# Patient Record
Sex: Female | Born: 1980 | Race: White | Hispanic: No | Marital: Married | Smoking: Current every day smoker
Health system: Southern US, Community
[De-identification: ages and names within clinical notes are randomized; demographics above are authoritative.]

## PROBLEM LIST (undated history)

## (undated) DIAGNOSIS — M79673 Pain in unspecified foot: Secondary | ICD-10-CM

## (undated) DIAGNOSIS — T753XXA Motion sickness, initial encounter: Secondary | ICD-10-CM

## (undated) DIAGNOSIS — M1990 Unspecified osteoarthritis, unspecified site: Secondary | ICD-10-CM

## (undated) HISTORY — PX: TONSILLECTOMY: SUR1361

## (undated) HISTORY — DX: Unspecified osteoarthritis, unspecified site: M19.90

---

## 2005-02-14 ENCOUNTER — Emergency Department: Payer: Self-pay | Admitting: Unknown Physician Specialty

## 2006-12-17 ENCOUNTER — Ambulatory Visit: Payer: Self-pay

## 2007-03-08 ENCOUNTER — Observation Stay: Payer: Self-pay | Admitting: Obstetrics and Gynecology

## 2007-04-22 ENCOUNTER — Observation Stay: Payer: Self-pay | Admitting: Obstetrics and Gynecology

## 2007-04-23 ENCOUNTER — Inpatient Hospital Stay: Payer: Self-pay | Admitting: Obstetrics and Gynecology

## 2013-03-15 ENCOUNTER — Ambulatory Visit: Payer: Self-pay | Admitting: Physician Assistant

## 2014-10-08 ENCOUNTER — Other Ambulatory Visit: Payer: Self-pay

## 2014-10-09 ENCOUNTER — Telehealth: Payer: Self-pay

## 2014-10-09 NOTE — Telephone Encounter (Signed)
-----   Message from Nada LibmanVance W Brabham, MD sent at 10/08/2014  1:54 PM EST ----- Regarding: RE: pre-op antibiotic Yes, I think she got vanco last time ----- Message -----    From: Erenest Blankarol Sue Manju Kulkarni, RN    Sent: 10/08/2014   1:28 PM      To: Nada LibmanVance W Brabham, MD Subject: pre-op antibiotic                              Spoke with pt's significant other today.  Wilkie AyeKristy saw her ID doctor recently, Dr. Jerolyn Centerynthia Snyder.  Was advised to go ahead and get pre-op antibiotic, due to the risk of infection.  Do you want me to order the Pre-op antibiotic for tomorrow's procedure?

## 2015-01-29 HISTORY — PX: OTHER SURGICAL HISTORY: SHX169

## 2015-02-15 ENCOUNTER — Encounter: Admit: 2015-02-15 | Disposition: A | Discharge status: Home / Self Care | Payer: Self-pay

## 2015-02-22 ENCOUNTER — Encounter: Payer: Self-pay | Admitting: Physical Therapy

## 2015-02-22 ENCOUNTER — Ambulatory Visit: Payer: Managed Care, Other (non HMO) | Attending: Orthopedic Surgery | Admitting: Occupational Therapy

## 2015-02-22 ENCOUNTER — Encounter: Payer: Self-pay | Admitting: Occupational Therapy

## 2015-02-22 DIAGNOSIS — M25531 Pain in right wrist: Secondary | ICD-10-CM | POA: Diagnosis not present

## 2015-02-22 DIAGNOSIS — Z9889 Other specified postprocedural states: Secondary | ICD-10-CM | POA: Insufficient documentation

## 2015-02-22 DIAGNOSIS — R29898 Other symptoms and signs involving the musculoskeletal system: Secondary | ICD-10-CM

## 2015-02-22 DIAGNOSIS — R600 Localized edema: Secondary | ICD-10-CM

## 2015-02-22 DIAGNOSIS — M79641 Pain in right hand: Secondary | ICD-10-CM | POA: Insufficient documentation

## 2015-02-22 DIAGNOSIS — M25541 Pain in joints of right hand: Secondary | ICD-10-CM

## 2015-02-22 DIAGNOSIS — M25641 Stiffness of right hand, not elsewhere classified: Secondary | ICD-10-CM | POA: Diagnosis not present

## 2015-02-22 DIAGNOSIS — L905 Scar conditions and fibrosis of skin: Secondary | ICD-10-CM | POA: Diagnosis not present

## 2015-02-22 DIAGNOSIS — M6281 Muscle weakness (generalized): Secondary | ICD-10-CM | POA: Diagnosis not present

## 2015-02-22 NOTE — Patient Instructions (Addendum)
Pt was educated to cont HEP, scar management, edema control and there ex, and begin using right hand for light functional activity/ADL's (ie; combing hair, brushing teeth, eating, applying make-up, washing dishes etc), pt was educated to not lift >3# with ight hand and she verbalized understanding of this in the clinic today.

## 2015-02-22 NOTE — Therapy (Signed)
[facility] [address] [place], [address] Phone: [phone]   Fax:  [phone]  Occupational Therapy Treatment  Patient Details  Name: Kim Middleton MRN: [id number] Date of Birth: September 03, 1981 Referring Provider:  Kennedy Bucker, MD  Encounter Date: 02/22/2015      OT End of Session - 02/22/15 1332    Visit Number 3   Number of Visits 8   Date for OT Re-Evaluation 03/11/15  Pt has MD appt 03/11/15, anticipated back to work 03/03/15   Authorization Type [employer]   OT Start Time 0805   OT Stop Time 0845   OT Time Calculation (min) 40 min   Activity Tolerance Patient tolerated treatment well;No increased pain   Behavior During Therapy [facility] for tasks assessed/performed      Past Medical History  Diagnosis Date  . Arthritis     Psoriatic    Past Surgical History  Procedure Laterality Date  . Carpal tunnel Right     There were no vitals filed for this visit.  Visit Diagnosis:  Pain in right wrist  Pain in joint of right hand  Edema of hand  Decreased grip strength of right hand      Subjective Assessment - 02/22/15 0805    Subjective  IT is getting better - scar is healling - pain is 4/10    Patient Stated Goals 11th May, do you think I can go back to work?   Currently in Pain? Yes   Pain Score 4    Pain Location Hand   Pain Orientation Right                      OT Treatments/Exercises (OP) - 02/22/15 0001    Exercises   Exercises Hand   Hand Exercises   Other Hand Exercises Gentle AROM ex's right hand for tendon gliding, opposition, thumb PA, RA   Modalities   Modalities Contrast Bath  18 min right hand   Ultrasound   Ultrasound Location Right carpal tunnel/volar wrist   Ultrasound Parameters 3.76mHz continuous [place] at .08 x23min   Ultrasound Goals Edema;Pain;Other (Comment)  scar management   RUE Contrast Bath   Time 18 minutes   Comments Pt tolerated  treatment w/o incident   Manual Therapy   Manual Therapy Edema management;Joint mobilization;Massage;Other (comment)  Scar mob/massage and scar management 10 min       Pt was educated to cont HEP, scar management, edema control and ther ex, and begin using right hand for light functional activity/ADL's (ie; combing hair, brushing teeth, eating, applying make-up, washing dishes etc), pt was educated to not lift >3# with ight hand and she verbalized understanding of this in the clinic today.          OT Education - 02/22/15 1329    Education provided Yes   Education Details Home program, splint use, daily activity/light functional use/ADL's only   Person(s) Educated Patient   Methods Explanation;Demonstration   Comprehension Verbalized understanding          OT Short Term Goals - 02/22/15 1339    OT SHORT TERM GOAL #1   Title Decreased pain on PRWHE by at least 20 points in 2-4 weeks   Time 4   Period Weeks   Status On-going   OT SHORT TERM GOAL #2   Title Increased thumb AROM to WFL's to perform 3 point pinch by 2-3 #    Time 2  Period Weeks   Status On-going   OT SHORT TERM GOAL #3   Title Increased AROM wrist WFL's all planes to be able to perform work w/o increased symptoms   Time 3   Period Weeks   Status On-going   OT SHORT TERM GOAL #4   Title Increased healing of incision for pt yo do scar massage and tolerate pressure and different textures    Time 2   Period Weeks   Status On-going           OT Long Term Goals - 02/22/15 1343    OT LONG TERM GOAL #1   Title Increased grip strength right hand by 50% compared to left hand to improve PRWHE by 20 points   Time 4   Period Weeks   Status On-going               Plan - 02/22/15 1335    Clinical Impression Statement Pt was seen today s/p Right CTR for HEP, scar management, edema control and increased functional use. Pt's scar is fully closed at this time and she reports doing her HEP/scar  management 3x/day at home. She rates her pain today as 4/10 "It's getting better"   Pt will benefit from skilled therapeutic intervention in order to improve on the following deficits (Retired) Decreased activity tolerance;Increased edema;Decreased strength;Pain;Impaired UE functional use;Decreased range of motion;Decreased coordination;Impaired flexibility;Decreased scar mobility;Decreased skin integrity;Impaired perceived functional ability   Rehab Potential Good   OT Frequency 2x / week   OT Duration 4 weeks   OT Treatment/Interventions Self-care/ADL training;Scar mobilization;DME and/or AE instruction;Parrafin;Passive range of motion;Patient/family education;Fluidtherapy;Contrast Bath;Splinting;Therapeutic exercise;Manual Therapy;Therapeutic exercises;Moist Heat;Therapeutic activities   Plan Review home prgram and light functional use right hand for ADL's and light homemaking.   Consulted and Agree with Plan of Care Patient        Problem List There are no active problems to display for this patient.   Charletta CousinBarnhill, Amy Beth Dixon, OTR/L 02/22/2015, 1:48 PM  [place] [facility] [address] [place], [place], [address] Phone: [phone]   Fax:  [phone]

## 2015-02-24 ENCOUNTER — Ambulatory Visit: Payer: Managed Care, Other (non HMO) | Admitting: Occupational Therapy

## 2015-02-24 DIAGNOSIS — M25531 Pain in right wrist: Secondary | ICD-10-CM

## 2015-02-24 DIAGNOSIS — M25541 Pain in joints of right hand: Secondary | ICD-10-CM

## 2015-02-24 DIAGNOSIS — R29898 Other symptoms and signs involving the musculoskeletal system: Secondary | ICD-10-CM

## 2015-02-24 DIAGNOSIS — R600 Localized edema: Secondary | ICD-10-CM

## 2015-02-24 DIAGNOSIS — M79641 Pain in right hand: Secondary | ICD-10-CM | POA: Diagnosis not present

## 2015-02-24 NOTE — Therapy (Signed)
[facility] [address] [place], [place], [address] Phone: [phone]   Fax:  [phone]  Occupational Therapy Treatment  Patient Details  Name: Kim CowerKristie L Middleton MRN: [id number] Date of Birth: 08-10-1981 Referring Provider:  Kennedy BuckerMenz, Michael, MD  Encounter Date: 02/24/2015      OT End of Session - 02/24/15 0854    Visit Number 4   Number of Visits 8   Date for OT Re-Evaluation 03/11/15   Authorization Type [employer]   OT Start Time 0805   OT Stop Time 0856   OT Time Calculation (min) 51 min   Activity Tolerance Patient tolerated treatment well   Behavior During Therapy [facility] for tasks assessed/performed      Past Medical History  Diagnosis Date  . Arthritis     Psoriatic    Past Surgical History  Procedure Laterality Date  . Carpal tunnel Right     There were no vitals filed for this visit.  Visit Diagnosis:  Pain in right wrist  Pain in joint of right hand  Edema of hand  Decreased grip strength of right hand      Subjective Assessment - 02/24/15 0816    Subjective  Pain about 2/10 , more of soreness as I use it more - and more down from the wrist - use it also little more in light activities    Patient Stated Goals 11th May, do you think I can go back to work?   Currently in Pain? Yes   Pain Score 2    Pain Location Hand            [facility] OT Assessment - 02/24/15 0001    ROM / Strength   AROM / PROM / Strength AROM   AROM   Right/Left Wrist Right   Right Wrist Extension 65 Degrees   Right Wrist Flexion 62 Degrees   Right Wrist Radial Deviation 16 Degrees   Right Wrist Ulnar Deviation 30 Degrees                  OT Treatments/Exercises (OP) - 02/24/15 0001    Hand Exercises   Other Hand Exercises Gentle AROM ex's right hand for tendon gliding, opposition, thumb PA, RA   Other Hand Exercises Provided yellow foam to cue composite flexion   Ultrasound   Ultrasound Location R CT scar   Ultrasound Parameters 3.mHz   Ultrasound Goals Edema;Pain;Other (Comment)   RUE Contrast Bath   Time 18 minutes   Comments Doing contrast 2 x day - to decrease pain and edema    Splinting   Splinting CMC noeprene spoint during day but to start taking off 2hrs on and off , still wrist extention splint at night time    Manual Therapy   Manual Therapy Other (comment)   Other Manual Therapy Scar massage and edema control massage -done - dirfferent texture  but pressure  of knife still bothering - can  dry hand  light clapping                 OT Education - 02/24/15 0900    Education provided Yes   Education Details Upgraded HEP and begin removing day splint for an hour per day.   Person(s) Educated Patient   Methods Explanation;Demonstration   Comprehension Verbalized understanding          OT Short Term Goals - 02/24/15 0901    OT SHORT TERM GOAL #1  Title Decreased pain on PRWHE by at least 20 points in 2-4 weeks   Time 4   Period Weeks   Status On-going   OT SHORT TERM GOAL #2   Title Increased thumb AROM to WFL's to perform 3 point pinch by 2-3 #    Time 2   Period Weeks   Status On-going   OT SHORT TERM GOAL #3   Title Increased AROM wrist WFL's all planes to be able to perform work w/o increased symptoms   Time 3   Period Weeks   Status On-going   OT SHORT TERM GOAL #4   Title Increased healing of incision for pt yo do scar massage and tolerate pressure and different textures    Time 2   Period Weeks   Status Achieved           OT Long Term Goals - 02/24/15 [id number]    OT LONG TERM GOAL #1   Title Increased grip strength right hand by 50% compared to left hand to improve PRWHE by 20 points   Time 4   Period Weeks   Status On-going               Plan - 02/24/15 [id number]    Clinical Impression Statement Pts pain iis improving, second digit MCP, PIP is somewhat sore and stiff. She was given additional ex's for HEP  that should assist in increasing AROM, decreasing pain and edema R hand. She will begin to remove splint for an hour an hour a day, continuing to wear it during heavier activity.   Pt will benefit from skilled therapeutic intervention in order to improve on the following deficits (Retired) Decreased activity tolerance;Increased edema;Decreased strength;Pain;Impaired UE functional use;Decreased range of motion;Decreased coordination;Impaired flexibility;Decreased scar mobility;Decreased skin integrity;Impaired perceived functional ability   Rehab Potential Good   OT Frequency 2x / week   OT Duration 4 weeks   OT Treatment/Interventions Self-care/ADL training;Scar mobilization;DME and/or AE instruction;Parrafin;Passive range of motion;Patient/family education;Fluidtherapy;Contrast Bath;Splinting;Therapeutic exercise;Manual Therapy;Therapeutic exercises;Moist Heat;Therapeutic activities   Plan Check pain and begin weaning out of night splint, review upgraded HEP.   Consulted and Agree with Plan of Care Patient        Problem List There are no active problems to display for this patient.   Charletta CousinBarnhill, Amy Beth Dixon, OTR/L 02/24/2015, 9:06 AM  [place] [facility] [address] [place], [place], [address] Phone: [phone]   Fax:  [phone]

## 2015-02-24 NOTE — Patient Instructions (Addendum)
Pt ed on scar desensitization  - rougher texture , clapping   as well as prayer stretch  For wrist exention  AROM for UD and RD   Built up  Handles for ADL's tools recommmended AROM: Wrist Radial / Ulnar Deviation   Gently bend left wrist from side to side as far as possible. Repeat ____ times per set. Do ____ sets per session. Do ____ sessions per day.  Copyright  VHI. All rights reserved.  Composite Extension (Passive Flexor Stretch)   Sitting with elbows on table and palms together, slowly lower wrists toward table until stretch is felt. Be sure to keep palms together throughout stretch. Hold ____ seconds. Relax. Repeat ____ times. Do ____ sessions per day.  Copyright  VHI. All rights reserved.

## 2015-03-01 ENCOUNTER — Ambulatory Visit: Payer: Managed Care, Other (non HMO) | Admitting: Occupational Therapy

## 2015-03-01 DIAGNOSIS — M25541 Pain in joints of right hand: Secondary | ICD-10-CM

## 2015-03-01 DIAGNOSIS — R600 Localized edema: Secondary | ICD-10-CM

## 2015-03-01 DIAGNOSIS — M79641 Pain in right hand: Secondary | ICD-10-CM | POA: Diagnosis not present

## 2015-03-01 DIAGNOSIS — R29898 Other symptoms and signs involving the musculoskeletal system: Secondary | ICD-10-CM

## 2015-03-01 DIAGNOSIS — M25531 Pain in right wrist: Secondary | ICD-10-CM

## 2015-03-01 NOTE — Therapy (Signed)
[place] [facility] [address] [place], [place], [address] Phone: [phone]   Fax:  [phone]  Occupational Therapy Treatment  Patient Details  Name: Kim Middleton MRN: [id number] Date of Birth: [birth year] Referring Provider:  Evon SlackGaines, Thomas C, PA-C  Encounter Date: 03/01/2015      OT End of Session - 03/01/15 0845    Visit Number 5   Number of Visits 8   Date for OT Re-Evaluation 03/11/15   Authorization Type [employer]   OT Start Time 0803   OT Stop Time 0847   OT Time Calculation (min) 44 min   Activity Tolerance Patient tolerated treatment well   Behavior During Therapy [facility] for tasks assessed/performed      Past Medical History  Diagnosis Date  . Arthritis     Psoriatic    Past Surgical History  Procedure Laterality Date  . Carpal tunnel Right     There were no vitals filed for this visit.  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 No/denies            [facility] - 03/01/15 0001    Strength   Right/Left hand Right   Grip (lbs) 26# on right   VS 66# left    Right Hand Lateral Pinch 5 lbs  5# on Right vs L 14#   Right Hand 3 Point Pinch 5 lbs  5# right  vs 12# left   Right Hand AROM   R Index  MCP 0-90 80 Degrees   R Index PIP 0-100 85 Degrees   R Long  MCP 0-90 90 Degrees   R Long PIP 0-100 90 Degrees   R Ring  MCP 0-90 90 Degrees   R Ring PIP 0-100 90 Degrees   R Little  MCP 0-90 90 Degrees   R Little PIP 0-100 95 Degrees                  OT Treatments/Exercises (OP) - 03/01/15 0001    ADLs   ADL Comments  Use palms of hands avoid pinches secondary to h/o tendonitis and arthritis.  Reviewed ADL activities and use of palms, avoid lateral pinc   Exercises   Exercises Hand   Hand Exercises   Theraputty Grip  right hand (grip only)   Theraputty - Grip right hand x10 x1-2x/day   Other Hand Exercises Gentle AROM ex's right hand for tendon gliding, opposition, thumb PA, RA  Able to oppose to second fold of 5th   Other Hand Exercises Provided yellow foam to cue composite flexion   Modalities   Modalities Ultrasound;Paraffin   Ultrasound   Ultrasound Location Right carpal tunnel/volar wrist   Ultrasound Parameters 3.583mHz continuous @ .08 w/cm2 x4 min   Ultrasound Goals Other (Comment)  Edema, pain, scar management   RUE Paraffin   Number Minutes Paraffin 10 Minutes   RUE Paraffin Location Hand   Type --  Right hand in fist for increase flexion especially of IF   Comments --  Pt reports that heat assists in increaseing flexibility/ROM   Manual Therapy   Manual Therapy Edema management;Massage;Passive ROM;Joint mobilization;Other (comment)  Edmea management, carpal spread, scar mobilization/managemen   Other Manual Therapy Scar massage and edema control massage -done - dirfferent texture  but pressure can bother her at times..Vibration/massage especially to proximal ulnar side of scar.      ADL and functional use of hands: Pt educated to avoid lateral pinch for functional activity secondary to increased pain and h/o tendonitis & arthritis IF. Use palms of hands to carry, examples of daily activities provided.           OT Education - 03/01/15 0844    Education provided Yes   Education Details Added putty for grip; ADL's, use of palms for lifting, avoid lateral pinch.   Person(s) Educated Patient   Methods Explanation;Demonstration   Comprehension Verbalized understanding          OT Short Term Goals - 03/01/15 0858    OT SHORT TERM GOAL #1   Title Decreased pain on PRWHE by at  least 20 points in 2-4 weeks   Time 4   Period Weeks   Status On-going   OT SHORT TERM GOAL #2   Title Increased thumb AROM to WFL's to perform 3 point pinch by 2-3 #    Time 2   Period Weeks   Status On-going   OT SHORT TERM GOAL #3   Title Increased AROM wrist WFL's all planes to be able to perform work w/o increased symptoms   Time 3   Period Weeks   Status On-going   OT SHORT TERM GOAL #4   Title Increased healing of incision for pt yo do scar massage and tolerate pressure and different textures            OT Long Term Goals - 03/01/15 0859    OT LONG TERM GOAL #1   Title Increased grip strength right hand by 50% compared to left hand to improve PRWHE by 20 points   Time 4   Period Weeks   Status On-going               Plan - 03/01/15 0847    Clinical Impression Statement Pt making great progress in pain , healing of scar , arthritis still limiting her at 2nd digit - grip and prehension strenght taken this date decrease - iniitiated teal putty but 2nd digits had increase pain - pt to only do grip - but hold off on lat  and 3 point, pt to focus on scar massage     Pt will benefit from skilled therapeutic intervention in order to improve on the following deficits (Retired) Decreased activity tolerance;Increased edema;Decreased strength;Pain;Impaired UE functional use;Decreased range of motion;Decreased coordination;Impaired flexibility;Decreased scar mobility;Decreased skin integrity;Impaired perceived functional ability   Rehab Potential Good   OT Frequency 2x / week   OT Treatment/Interventions Self-care/ADL training;Scar mobilization;DME and/or AE instruction;Parrafin;Passive range of motion;Patient/family education;Fluidtherapy;Contrast Bath;Splinting;Therapeutic exercise;Manual Therapy;Therapeutic exercises;Moist Heat;Therapeutic activities   OT Home Exercise Plan PUtty gripping but pain less than 3/10 ; and scar massage   Consulted and Agree with Plan of Care  Patient        Problem List There are no active problems to display for this patient.   Oletta CohnuPreez, Valma Rotenberg 03/01/2015, 9:03 AM  [facility] [address] [place], [place], [address] Phone: [phone]   Fax:  [phone]

## 2015-03-01 NOTE — Patient Instructions (Signed)
ADL and functional use of hands: Pt educated to avoid lateral pinch for functional activity secondary to increased pain and h/o tendonitis & arthritis IF. Use palms of hands to carry, examples of daily activities provided.

## 2015-03-03 ENCOUNTER — Encounter: Payer: Self-pay | Admitting: Occupational Therapy

## 2015-03-04 ENCOUNTER — Encounter: Payer: Managed Care, Other (non HMO) | Admitting: Occupational Therapy

## 2015-03-08 ENCOUNTER — Encounter: Payer: Self-pay | Admitting: Occupational Therapy

## 2015-03-08 ENCOUNTER — Ambulatory Visit: Payer: Managed Care, Other (non HMO) | Admitting: Occupational Therapy

## 2015-03-08 DIAGNOSIS — R29898 Other symptoms and signs involving the musculoskeletal system: Secondary | ICD-10-CM

## 2015-03-08 DIAGNOSIS — M25531 Pain in right wrist: Secondary | ICD-10-CM

## 2015-03-08 DIAGNOSIS — R600 Localized edema: Secondary | ICD-10-CM

## 2015-03-08 DIAGNOSIS — M25541 Pain in joints of right hand: Secondary | ICD-10-CM

## 2015-03-08 DIAGNOSIS — M79641 Pain in right hand: Secondary | ICD-10-CM | POA: Diagnosis not present

## 2015-03-08 NOTE — Therapy (Signed)
[place] [facility] [address] [place], [place], [address] Phone: [phone]   Fax:  [phone]  Occupational Therapy Treatment  Patient Details  Name: Kim CowerKristie L Knee MRN: [id number] Date of Birth: 1981-02-19 Referring Provider:  Kennedy BuckerMenz, Michael, MD  Encounter Date: 03/08/2015      OT End of Session - 03/08/15 0930    OT Start Time 0801   OT Stop Time 0850   OT Time Calculation (min) 49 min      Past Medical History  Diagnosis Date  . Arthritis     Psoriatic    Past Surgical History  Procedure Laterality Date  . Carpal tunnel Right     There were no vitals filed for this visit.  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, puth broom, and then also unpacking some to the boxes -went back into my splints    Patient Stated Goals Get the pain better again and not using the splints    Pain Score 3    Pain Location Hand   Pain Orientation Right                      OT Treatments/Exercises (OP) - 03/08/15 0001    Exercises   Exercises Hand   Hand Exercises   Other Hand Exercises AROM of MC flexion with IP extention, blocked AROM intrinsic fist,, full fist to 2 cm foam block and then 1 cm foam block    Other Hand Exercises Unable to touch - add buddy strap to use one hour on and off during day to decrease stiffness and increase use of 2nd digit   Ultrasound   Ultrasound Location R CT scar    Ultrasound Parameters 3.3MHZ, CUS at 0.8 for 4 min    Ultrasound Goals Edema;Pain   RUE Contrast Bath   Time 18 minutes   Comments to decrease pain and edema    Splinting   Splinting Pt wearing wrist splint with metal  bar at work and sleeping    Manual Therapy   Manual Therapy Edema management;Other (comment)   Other Manual Therapy Scar masage and mobs done - provided pt iwith coban for traction to do scar mobs especially to proximal scar - tender and thick - and adhere - carpal spreads and husband can help -                 OT Education - 03/08/15 0927    Education provided Yes   Education Details see instructions   Methods Explanation;Demonstration;Verbal cues   Comprehension Verbalized understanding;Returned demonstration          OT Short Term Goals - 03/01/15 0858    OT SHORT TERM GOAL #1   Title Decreased pain on PRWHE by at least 20 points in 2-4 weeks   Time 4   Period Weeks   Status On-going   OT SHORT TERM GOAL #2   Title Increased thumb AROM to WFL's to perform 3 point pinch by 2-3 #    Time 2   Period Weeks   Status On-going  OT SHORT TERM GOAL #3   Title Increased AROM wrist WFL's all planes to be able to perform work w/o increased symptoms   Time 3   Period Weeks   Status On-going   OT SHORT TERM GOAL #4   Title Increased healing of incision for pt yo do scar massage and tolerate pressure and different textures            OT Long Term Goals - 03/01/15 0859    OT LONG TERM GOAL #1   Title Increased grip strength right hand by 50% compared to left hand to improve PRWHE by 20 points   Time 4   Period Weeks   Status On-going               Plan - 03/08/15 [id number]    Clinical Impression Statement Pt had this date increase edema and pain since started back to work alst WEd - pt wearing splints again on wrist - reviewed some joint protection principles and to do contrast - cont with scar massage and buddstrap to decrease stiffness at 2nd digit   Pt will benefit from skilled therapeutic intervention in order to improve on the following deficits (Retired) Decreased activity tolerance;Increased edema;Decreased strength;Pain;Impaired UE functional use;Decreased  range of motion;Decreased coordination;Impaired flexibility;Decreased scar mobility;Decreased skin integrity;Impaired perceived functional ability   Rehab Potential Good   OT Frequency 2x / week   OT Duration 4 weeks   OT Treatment/Interventions Self-care/ADL training;Scar mobilization;DME and/or AE instruction;Parrafin;Passive range of motion;Patient/family education;Fluidtherapy;Contrast Bath;Splinting;Therapeutic exercise;Manual Therapy;Therapeutic exercises;Moist Heat;Therapeutic activities   Plan Assess pt has appt with MD on Thursday , assess use of buddystrap and ROM at 2nd digit - pain level - able to use joint protection at work    OT Home Exercise Plan see instructions   Consulted and Agree with Plan of Care Patient        Problem List There are no active problems to display for this patient.   Oletta CohnuPreez, Marica Trentham OTR/L ,CLT 03/08/2015, 9:33 AM  [place] [facility] [address] , [place], [address] Phone: [phone]   Fax:  [phone]

## 2015-03-08 NOTE — Patient Instructions (Signed)
Scar massage can use coban for more traction - and then husband can do some carpal spreads  Prior to ROM    Buddy strap fitted with pt to use on 2nd and 3rd digit to decrease stiffness on 2nd

## 2015-03-11 ENCOUNTER — Ambulatory Visit: Payer: Managed Care, Other (non HMO) | Admitting: Occupational Therapy

## 2015-03-11 ENCOUNTER — Encounter: Payer: Self-pay | Admitting: Occupational Therapy

## 2015-03-11 DIAGNOSIS — M79641 Pain in right hand: Secondary | ICD-10-CM | POA: Diagnosis not present

## 2015-03-11 DIAGNOSIS — R29898 Other symptoms and signs involving the musculoskeletal system: Secondary | ICD-10-CM

## 2015-03-11 DIAGNOSIS — M25531 Pain in right wrist: Secondary | ICD-10-CM

## 2015-03-11 DIAGNOSIS — R600 Localized edema: Secondary | ICD-10-CM

## 2015-03-11 DIAGNOSIS — M25541 Pain in joints of right hand: Secondary | ICD-10-CM

## 2015-03-11 NOTE — Therapy (Signed)
[facility] [address] [place], [place], [address] Phone: [phone]   Fax:  [phone]  Occupational Therapy Treatment  Patient Details  Name: Kim Middleton MRN: [id number] Date of Birth: [birth year] Referring Provider:  Kennedy BuckerMenz, Michael, MD  Encounter Date: 03/11/2015      OT End of Session - 03/11/15 1221    Visit Number 7   Number of Visits 8   Date for OT Re-Evaluation 03/11/15   Authorization Type [employer]   OT Start Time [phone]   OT Stop Time 0844   OT Time Calculation (min) 40 min   Activity Tolerance Patient tolerated treatment well   Behavior During Therapy [facility] for tasks assessed/performed      Past Medical History  Diagnosis Date  . Arthritis     Psoriatic    Past Surgical History  Procedure Laterality Date  . Carpal tunnel Right     There were no vitals filed for this visit.  Visit Diagnosis:  Pain in right wrist - Plan: Ot plan of care cert/re-cert  Pain in joint of right hand - Plan: Ot plan of care cert/re-cert  Edema of hand - Plan: Ot plan of care cert/re-cert  Decreased grip strength of right hand - Plan: Ot plan of care cert/re-cert      Subjective Assessment - 03/11/15 0818    Subjective  I have more pain - about 7-8/10 coming in - mostly in 2nd digit and wrist - more swelling too - and trying modifying how I pickup do things - don't know if it is more my arthritis    Patient Stated Goals Get the pain better again and not using the splints    Currently in Pain? Yes   Pain Score 7    Pain Location Wrist   Pain Orientation Right   Pain Descriptors / Indicators Aching;Shooting   Pain Type Surgical pain   Pain Onset 1 to 4 weeks ago   Pain Relieving Factors Heat and ICe             [facility] OT Assessment - 03/11/15 0001    AROM   Right Wrist Extension 55 Degrees   Right Hand AROM   R Index  MCP 0-90 60 Degrees   R Index PIP 0-100 70 Degrees   R Long  MCP  0-90 75 Degrees   R Long PIP 0-100 90 Degrees   R Ring  MCP 0-90 82 Degrees   R Ring PIP 0-100 90 Degrees   R Little  MCP 0-90 82 Degrees   R Little PIP 0-100 95 Degrees                  OT Treatments/Exercises (OP) - 03/11/15 0001    Hand Exercises   Other Hand Exercises AROM of MC flexion with PIP extention , PIP flexion blocked and AROM to 3 fingers of OT out of palm in pain free range    Other Hand Exercises Unable to touch palm - to cont with buddy strap but hold off on any putty HEP and do not force fisting AROM past 2-3/10 pain    Ultrasound   Ultrasound Location R CTR scar    Ultrasound Parameters CUS at 3/.183mhz at 0.8 intensity 5 min at end of ession    Ultrasound Goals Pain   RUE Contrast Bath   Time 18 minutes   Comments decrease pain and edema    Splinting   Splinting Switch  to neoprene wrist splint to use at work for typing and lifting or gripping    Manual Therapy   Manual Therapy Edema management;Other (comment)   Other Manual Therapy Scar mobs done , carpal spreads ,  still adhere proximal at scar and tender                 OT Education - 03/11/15 1221    Education provided Yes   Education Details see pt instruction   Person(s) Educated Patient   Methods Explanation;Demonstration   Comprehension Verbalized understanding;Returned demonstration          OT Short Term Goals - 03/11/15 1226    OT SHORT TERM GOAL #1   Title Decreased pain on PRWHE by at least 20 points in 2-4 weeks   Time 4   Period Weeks   Status On-going   OT SHORT TERM GOAL #2   Title Increased thumb AROM to WFL's to perform 3 point pinch by 2-3 #    Time 2   Period Weeks   Status On-going   OT SHORT TERM GOAL #3   Title Increased AROM wrist WFL's all planes to be able to perform work w/o increased symptoms   Time 3   Period Weeks   Status On-going   OT SHORT TERM GOAL #4   Title Increased healing of incision for pt yo do scar massage and tolerate pressure and  different textures    Status Achieved           OT Long Term Goals - 03/11/15 1227    OT LONG TERM GOAL #1   Title Increased grip strength right hand by 50% compared to left hand to improve PRWHE by 20 points   Time 4   Period Weeks   Status On-going               Plan - 03/11/15 1223    Clinical Impression Statement Pt has this date increase pain and edema in R dominant hand - since she returned back to work - mostly in 2nd  digit MC and PIP - as well as over CTR ( volar wrist) - decrease AROM at wrist and digitis - unable to do any strenghtening - did educate pt on joint protection and modifications- pt has history of  Psoriatic arthritis - pain limitiing her progress - pt to see MD this date - will cont to increase ROM , decrease pain and if pain undercontral - will cont strengthenign    Pt will benefit from skilled therapeutic intervention in order to improve on the following deficits (Retired) Decreased activity tolerance;Increased edema;Decreased strength;Pain;Impaired UE functional use;Decreased range of motion;Decreased coordination;Impaired flexibility;Decreased scar mobility;Decreased skin integrity;Impaired perceived functional ability   Rehab Potential Good   Clinical Impairments Affecting Rehab Potential arthritis    OT Duration 4 weeks   Plan Check on what MD said - pain level and use of neoprene splint for wrist at work - assess grip and prehension - PRWHE   OT Home Exercise Plan see instructions   Consulted and Agree with Plan of Care Patient        Problem List There are no active problems to display for this patient.   Oletta CohnuPreez, Izaha Shughart OTR/L, CLT 03/11/2015, 12:38 PM  [facility] [address] [place], [place], [address] Phone: [phone]   Fax:  [phone]

## 2015-03-11 NOTE — Patient Instructions (Signed)
Pt to hold off on putty and AROM tendon gliding in pain free range - pain under 2-3/10   Instead of hard wrist splint - try neoprene wrist splint for cushioning and maybe no fighting splint as with hard one

## 2015-03-15 ENCOUNTER — Ambulatory Visit: Payer: Managed Care, Other (non HMO) | Admitting: Occupational Therapy

## 2015-03-15 ENCOUNTER — Encounter: Payer: Self-pay | Admitting: Occupational Therapy

## 2015-03-15 DIAGNOSIS — M25541 Pain in joints of right hand: Secondary | ICD-10-CM

## 2015-03-15 DIAGNOSIS — M79641 Pain in right hand: Secondary | ICD-10-CM | POA: Diagnosis not present

## 2015-03-15 DIAGNOSIS — R600 Localized edema: Secondary | ICD-10-CM

## 2015-03-15 DIAGNOSIS — M25531 Pain in right wrist: Secondary | ICD-10-CM

## 2015-03-15 DIAGNOSIS — R29898 Other symptoms and signs involving the musculoskeletal system: Secondary | ICD-10-CM

## 2015-03-15 NOTE — Patient Instructions (Addendum)
Cont HEP as previously outlined. Wear scar pad at noc. As pt states that she has not for past 2 nights. Issued elastic stockinette to hold scar pad in place at noc.

## 2015-03-15 NOTE — Therapy (Signed)
[facility] [address] [place], [address] Phone: [phone]   Fax:  [phone]  Occupational Therapy Treatment  Patient Details  Name: Kim Middleton MRN: [id number] Date of Birth: 1980-12-07 Referring Provider:  Kennedy Bucker, MD  Encounter Date: 03/15/2015      OT End of Session - 03/15/15 0849    Visit Number 8   Number of Visits 8   Date for OT Re-Evaluation 03/11/15   Authorization Type [employer]   OT Start Time 0800   OT Stop Time [phone]   OT Time Calculation (min) 42 min   Activity Tolerance Patient tolerated treatment well   Behavior During Therapy [employer] for tasks assessed/performed      Past Medical History  Diagnosis Date  . Arthritis     Psoriatic    Past Surgical History  Procedure Laterality Date  . Carpal tunnel Right     There were no vitals filed for this visit.  Visit Diagnosis:  Pain in right wrist  Pain in joint of right hand  Edema of hand  Decreased grip strength of right hand      Subjective Assessment - 03/15/15 0809    Subjective  Pain continuing in right IF,3-4/10 pain. Pt reports that she is performing HEP and ther ex, and is wondering if symptoms are related to arthritis than along carpal tunnel. Pt is taking a new medication that she began on Sat. (see medication list).   Patient Stated Goals Get the pain better again and not using the splints    Currently in Pain? Yes   Pain Score 3    Pain Location Other (Comment)  Hand/wrist   Pain Orientation Right   Pain Descriptors / Indicators Aching;Sore;Other (Comment)  Stiff   Pain Type Surgical pain   Pain Onset 1 to 4 weeks ago   Pain Relieving Factors Heat and ice                      OT Treatments/Exercises (OP) - 03/15/15 0001    Exercises   Exercises Hand   Hand Exercises   Other Hand Exercises AROM of MC flexion with PIP extention , PIP flexion blocked and AROM to 3  fingers of OT out of palm in pain free range    Other Hand Exercises Unable to touch palm - to cont with buddy strap but hold off on any putty HEP and do not force fisting AROM past 2-3/10 pain    Modalities   Modalities Ultrasound   Ultrasound   Ultrasound Location R CTR scar   Ultrasound Parameters CUS @ 3.3 0.8w/cm2 x5 min   Ultrasound Goals Edema;Pain   RUE Contrast Bath   Time 18 minutes   Comments Decrease pai nand edema   Splinting   Splinting Pt reports  neoprene wrist splint use at work for typing and lifting or gripping    Manual Therapy   Manual Therapy Edema management;Other (comment)   Other Manual Therapy Scar mobs done , carpal spreads ,  still adhere proximal at scar and tender       Cont HEP as previously outlined. Wear scar pad at noc. As pt states that she has not for past 2 nights. Issued elastic stockinette to hold scar pad in place at noc.            OT Education - 03/15/15 0848    Education provided Yes   Education Details Cont  HEP as previously outlined. Wear scar pad at noc. As pt states that she has not for past 2 nights.   Person(s) Educated Patient   Methods Explanation;Demonstration   Comprehension Verbalized understanding;Returned demonstration          OT Short Term Goals - 03/11/15 1226    OT SHORT TERM GOAL #1   Title Decreased pain on PRWHE by at least 20 points in 2-4 weeks   Time 4   Period Weeks   Status On-going   OT SHORT TERM GOAL #2   Title Increased thumb AROM to WFL's to perform 3 point pinch by 2-3 #    Time 2   Period Weeks   Status On-going   OT SHORT TERM GOAL #3   Title Increased AROM wrist WFL's all planes to be able to perform work w/o increased symptoms   Time 3   Period Weeks   Status On-going   OT SHORT TERM GOAL #4   Title Increased healing of incision for pt yo do scar massage and tolerate pressure and different textures    Status Achieved           OT Long Term Goals - 03/11/15 1227    OT LONG TERM  GOAL #1   Title Increased grip strength right hand by 50% compared to left hand to improve PRWHE by 20 points   Time 4   Period Weeks   Status On-going               Plan - 03/15/15 0851    Clinical Impression Statement Pt has noted increased edema and pain right dominant hand/wrist w/ return to work > in IF at MCP/PIP & volar wrist over carpal tunnel. Pt is currently unable to perform strengthening, and has begun a new medication for pain/inflammation. Pt states that buddy strap use on IF/MF have asisted in increasing AROM and she should benefit from cont hand therapy to assress symptoms.   Pt will benefit from skilled therapeutic intervention in order to improve on the following deficits (Retired) Decreased activity tolerance;Increased edema;Decreased strength;Pain;Impaired UE functional use;Decreased range of motion;Decreased coordination;Impaired flexibility;Decreased scar mobility;Decreased skin integrity;Impaired perceived functional ability   Rehab Potential Good   Clinical Impairments Affecting Rehab Potential arthritis    OT Frequency 2x / week   OT Duration 4 weeks   OT Treatment/Interventions Self-care/ADL training;Scar mobilization;DME and/or AE instruction;Parrafin;Passive range of motion;Patient/family education;Fluidtherapy;Contrast Bath;Splinting;Therapeutic exercise;Manual Therapy;Therapeutic exercises;Moist Heat;Therapeutic activities   Plan Assess grip/prehension and PRWHE next visit   Consulted and Agree with Plan of Care Patient        Problem List There are no active problems to display for this patient.   Charletta CousinBarnhill, Collie Kittel Beth Dixon, OTR/L 03/15/2015, 9:46 AM  [facility] [address] [place], [place], [address] Phone: [phone]   Fax:  [phone]

## 2015-03-18 ENCOUNTER — Ambulatory Visit: Payer: Managed Care, Other (non HMO) | Admitting: Occupational Therapy

## 2015-03-18 DIAGNOSIS — M79641 Pain in right hand: Secondary | ICD-10-CM | POA: Diagnosis not present

## 2015-03-18 DIAGNOSIS — M25541 Pain in joints of right hand: Secondary | ICD-10-CM

## 2015-03-18 DIAGNOSIS — R29898 Other symptoms and signs involving the musculoskeletal system: Secondary | ICD-10-CM

## 2015-03-18 DIAGNOSIS — M25531 Pain in right wrist: Secondary | ICD-10-CM

## 2015-03-18 DIAGNOSIS — R600 Localized edema: Secondary | ICD-10-CM

## 2015-03-18 NOTE — Therapy (Signed)
[facility] [address] [place], [place], [address] Phone: [phone]   Fax:  [phone]  Occupational Therapy Treatment  Patient Details  Name: Kim Middleton MRN: [id number] Date of Birth: February 02, 1981 Referring Provider:  Evon SlackGaines, Thomas C, PA-C  Encounter Date: 03/18/2015      OT End of Session - 03/18/15 0915    Visit Number 9   Number of Visits 14   Date for OT Re-Evaluation 04/08/15   Authorization Type [employer]   OT Start Time 0802   OT Stop Time 0846   OT Time Calculation (min) 44 min   Activity Tolerance Patient tolerated treatment well   Behavior During Therapy [facility] for tasks assessed/performed      Past Medical History  Diagnosis Date  . Arthritis     Psoriatic    Past Surgical History  Procedure Laterality Date  . Carpal tunnel Right     There were no vitals filed for this visit.  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 Yes   Pain Score 4    Pain Location Hand   Pain Orientation Right   Pain Descriptors / Indicators Aching   Pain Type Surgical pain   Pain Onset 1 to 4 weeks ago            [facility] Assessment - 03/18/15 0001    AROM   Right Wrist Extension 62 Degrees   Right Wrist Flexion 58 Degrees   Right Wrist Radial Deviation 17 Degrees   Right Wrist Ulnar Deviation 38 Degrees   Strength   Right Hand Grip (lbs) 10   Right Hand Lateral Pinch 3 lbs   Right Hand 3 Point Pinch 4 lbs   Left Hand AROM   L Thumb MCP 0-60 55 Degrees   L Thumb IP 0-80 10 Degrees   L Index  MCP 0-90 65 Degrees   L Index PIP 0-100 80 Degrees   L Long  MCP 0-90 75 Degrees   L Long PIP 0-100 100 Degrees                  OT Treatments/Exercises (OP) - 03/18/15 0001    Hand Exercises   Other Hand Exercises AROM   Other Hand Exercises Unable to touch palm - to cont with buddy strap but hold off on any putty HEP and do not force fisting AROM past 2-3/10 pain    Ultrasound   Ultrasound Location R carpal tunnel and volar wrist    Ultrasound Parameters CUS at 3.3MHZ  at 0.8 over CTR for 4 min prior to manual - - AND then this date over posterior 2nd MC and thumb MP -  [place] at 20% at 1.0 intensity for 4 min at end of session    Ultrasound Goals Edema;Pain;Other (Comment)   Splinting   Splinting Pt to switch to [facility] neoprene splint for thumb and some compression to  poster   Manual Therapy   Manual Therapy Edema management;Other (comment)   Other Manual Therapy Scar mobs done , carpal spreads ,  still adhere proximal at scar and tender                 OT Education - 03/18/15 0914    Education provided Yes   Education Details HEP and splint wearing    Person(s) Educated Patient   Methods Explanation;Demonstration;Verbal cues   Comprehension Verbalized understanding;Returned demonstration;Verbal cues required          OT Short Term Goals - 03/18/15 0919    OT SHORT TERM GOAL #1   Title Decreased pain on PRWHE by at least 20 points in 2-4 weeks   Baseline PRWHE now pain 41/50 WAS 42/15; Function now 35.5/50  WAS 46/50   Time 3   Period Weeks   Status On-going   OT SHORT TERM GOAL #2   Title Increased thumb AROM to WFL's to perform 3 point pinch by 2-3 #    Baseline Prehension 4-5 lbs on L   Time 3   Period Weeks   Status  On-going   OT SHORT TERM GOAL #3   Title Increased AROM wrist WFL's all planes to be able to perform work w/o increased symptoms   Time 3   Period Weeks   Status On-going   OT SHORT TERM GOAL #4   Title Increased healing of incision for pt yo do scar massage and tolerate pressure and different textures    Baseline Set back with arthritis when she went back to work - still deep pressure is discomfort   Time 2   Period Weeks   Status On-going           OT Long Term Goals - 03/18/15 1610    OT LONG TERM GOAL #1   Title Increased grip strength right hand by 50% compared to left hand to improve PRWHE by 20 points   Time 3   Period Weeks   Status On-going               Plan - 03/18/15 0916    Clinical Impression Statement Pt cont to have increase pain over thumb and 2nd MC - mostly arthriits - CTR appear improving well but still scar adhere proximal  but grip and prehension decrease as well as edema /pain increased - AROM of 2nd diigit decrease - phone DR Gavin Potters rheumatholgy and nsg will phone her to get her in and assess  - pt can benefit from  cont services to increase ROM and strength if pain decrease    Pt will benefit from skilled therapeutic intervention in order to improve on the following deficits (Retired) Decreased activity tolerance;Increased edema;Decreased strength;Pain;Impaired UE functional use;Decreased range of motion;Decreased coordination;Impaired flexibility;Decreased scar mobility;Decreased skin integrity;Impaired perceived functional ability   Rehab Potential Good   Clinical Impairments Affecting Rehab Potential arthritis    OT Frequency 2x / week   OT Duration Other (comment)   OT Treatment/Interventions Self-care/ADL training;Scar mobilization;DME and/or AE instruction;Parrafin;Passive range of motion;Patient/family education;Fluidtherapy;Contrast Bath;Splinting;Therapeutic exercise;Manual Therapy;Therapeutic exercises;Moist Heat;Therapeutic activities    OT Home Exercise Plan see instructions   Consulted and Agree with Plan of Care Patient  Problem List There are no active problems to display for this patient.   Elida Harbin OTR/L ,CLT 03/18/2015, 9:28 AM  [facility] [address] [place], [address] Phone: [phone]   Fax:  [phone]

## 2015-03-18 NOTE — Patient Instructions (Signed)
Pt to only do AROM without increasing pain -  Switch back to thumb neoprene splint  For compression over thumb and support as well as over posterior hand

## 2015-03-24 ENCOUNTER — Ambulatory Visit: Payer: Managed Care, Other (non HMO) | Attending: Orthopedic Surgery | Admitting: Occupational Therapy

## 2015-03-29 ENCOUNTER — Ambulatory Visit: Payer: Managed Care, Other (non HMO) | Admitting: Occupational Therapy

## 2015-03-31 ENCOUNTER — Ambulatory Visit: Payer: Managed Care, Other (non HMO) | Admitting: Occupational Therapy

## 2015-04-05 ENCOUNTER — Ambulatory Visit: Payer: Managed Care, Other (non HMO) | Admitting: Occupational Therapy

## 2015-04-07 ENCOUNTER — Ambulatory Visit: Payer: Managed Care, Other (non HMO) | Admitting: Occupational Therapy

## 2015-04-12 ENCOUNTER — Encounter: Payer: Managed Care, Other (non HMO) | Admitting: Occupational Therapy

## 2015-04-15 ENCOUNTER — Encounter: Payer: Managed Care, Other (non HMO) | Admitting: Occupational Therapy

## 2015-04-19 ENCOUNTER — Encounter: Payer: Managed Care, Other (non HMO) | Admitting: Occupational Therapy

## 2015-04-21 ENCOUNTER — Encounter: Payer: Managed Care, Other (non HMO) | Admitting: Occupational Therapy

## 2016-03-04 ENCOUNTER — Encounter: Payer: Self-pay | Admitting: Emergency Medicine

## 2016-03-04 ENCOUNTER — Emergency Department
Admission: EM | Admit: 2016-03-04 | Discharge: 2016-03-04 | Disposition: A | Discharge status: Home / Self Care | Payer: Managed Care, Other (non HMO) | Attending: Emergency Medicine | Admitting: Emergency Medicine

## 2016-03-04 ENCOUNTER — Emergency Department: Payer: Managed Care, Other (non HMO)

## 2016-03-04 DIAGNOSIS — S20211A Contusion of right front wall of thorax, initial encounter: Secondary | ICD-10-CM | POA: Diagnosis not present

## 2016-03-04 DIAGNOSIS — S299XXA Unspecified injury of thorax, initial encounter: Secondary | ICD-10-CM | POA: Diagnosis present

## 2016-03-04 DIAGNOSIS — Y929 Unspecified place or not applicable: Secondary | ICD-10-CM | POA: Diagnosis not present

## 2016-03-04 DIAGNOSIS — Y9384 Activity, sleeping: Secondary | ICD-10-CM | POA: Insufficient documentation

## 2016-03-04 DIAGNOSIS — W1789XA Other fall from one level to another, initial encounter: Secondary | ICD-10-CM | POA: Diagnosis not present

## 2016-03-04 DIAGNOSIS — Y999 Unspecified external cause status: Secondary | ICD-10-CM | POA: Diagnosis not present

## 2016-03-04 DIAGNOSIS — F17210 Nicotine dependence, cigarettes, uncomplicated: Secondary | ICD-10-CM | POA: Insufficient documentation

## 2016-03-04 MED ORDER — HYDROCODONE-ACETAMINOPHEN 5-325 MG PO TABS: 1.0000 | ORAL_TABLET | ORAL | Status: DC | PRN

## 2016-03-04 MED ORDER — CYCLOBENZAPRINE HCL 10 MG PO TABS: 10.0000 mg | ORAL_TABLET | Freq: Three times a day (TID) | ORAL | Status: DC | PRN

## 2016-03-04 MED ORDER — HYDROCODONE-ACETAMINOPHEN 5-325 MG PO TABS
1.0000 | ORAL_TABLET | Freq: Once | ORAL | Status: AC
  Administered 2016-03-04: 1 via ORAL
  Filled 2016-03-04: qty 1

## 2016-03-04 NOTE — ED Notes (Signed)
Larey SeatFell out of hammock one week ago, pain R chest pain since.

## 2016-03-04 NOTE — ED Notes (Signed)
Discussed discharge instructions, prescriptions, and follow-up care with patient. No questions or concerns at this time. Pt stable at discharge.  

## 2016-03-04 NOTE — ED Provider Notes (Signed)
[facility] Provider Note ____________________________________________  Time seen: Approximately 4:39 PM  I have reviewed the triage vital signs and the nursing notes.   HISTORY  Chief Complaint Chest Injury    HPI Kim Middleton is a 35 y.o. female who presents to the emergency department for evaluation of right rib pain. She was asleep in the hammock about a week ago when it broke and she fell to the ground. Pain has not improved over the week with OTC medications. She denies dyspnea.  Past Medical History  Diagnosis Date  . Arthritis     Psoriatic    There are no active problems to display for this patient.   Past Surgical History  Procedure Laterality Date  . Carpal tunnel Right   . Tonsillectomy      Current Outpatient Rx  Name  Route  Sig  Dispense  Refill  . Apremilast 30 MG TABS   Oral   Take by mouth.         . cyclobenzaprine (FLEXERIL) 10 MG tablet   Oral   Take 1 tablet (10 mg total) by mouth 3 (three) times daily as needed for muscle spasms.   30 tablet   0   . etodolac (LODINE) 500 MG tablet   Oral   Take 500 mg by mouth 2 (two) times daily.         Marland Kitchen. HYDROcodone-acetaminophen (NORCO/VICODIN) 5-325 MG tablet   Oral   Take 1 tablet by mouth every 4 (four) hours as needed.   12 tablet   0     Allergies Latex and Nickel  No family history on file.  Social History Social History  Substance Use Topics  . Smoking status: Current Every Day Smoker -- 1.00 packs/day    Types: Cigarettes  . Smokeless tobacco: None  . Alcohol Use: No    Review of Systems Constitutional: No recent illness. Cardiovascular: Denies chest pain or palpitations. Respiratory: Denies shortness of breath. Musculoskeletal: Pain in right rib area Skin: Negative for rash, wound, lesion. Neurological: Negative for focal weakness or numbness.  ____________________________________________   PHYSICAL EXAM:  VITAL  SIGNS: ED Triage Vitals  Enc Vitals Group     BP 03/04/16 1624 137/78 mmHg     Pulse Rate 03/04/16 1624 77     Resp 03/04/16 1624 18     Temp 03/04/16 1624 98.4 F (36.9 C)     Temp Source 03/04/16 1624 Oral     SpO2 03/04/16 1624 100 %     Weight 03/04/16 1624 190 lb (86.183 kg)     Height 03/04/16 1624 5\' 7"  (1.702 m)     Head Cir --      Peak Flow --      Pain Score 03/04/16 1626 3     Pain Loc --      Pain Edu? --      Excl. in GC? --     Constitutional: Alert and oriented. Well appearing and in no acute distress. Eyes: Conjunctivae are normal. EOMI. Head: Atraumatic. Neck: No stridor.  Respiratory: Normal respiratory effort.  Breath sounds clear. Musculoskeletal: Right lateral thorax tender to palpation without flail segment.  Neurologic:  Normal speech and language. No gross focal neurologic deficits are appreciated. Speech is normal. No gait instability. Skin:  Skin is warm, dry and intact. Atraumatic. Psychiatric: Mood and affect are normal. Speech and behavior are normal.  ____________________________________________   LABS (all labs ordered are listed, but only abnormal  results are displayed)  Labs Reviewed - No data to display ____________________________________________  RADIOLOGY  Negative for acute abnormality per radiology. ____________________________________________   PROCEDURES  Procedure(s) performed: None   ____________________________________________   INITIAL IMPRESSION / ASSESSMENT AND PLAN / ED COURSE  Pertinent labs & imaging results that were available during my care of the patient were reviewed by me and considered in my medical decision making (see chart for details).  Patient to take norco as prescribed for severe pain. She is to follow up with the PCP for symptoms that are not improving over the week or return to the ER for symptoms that change or worsen. ____________________________________________   FINAL CLINICAL  IMPRESSION(S) / ED DIAGNOSES  Final diagnoses:  Rib contusion, right, initial encounter       Chinita Pester, FNP 03/04/16 2310  Jene Every, MD 03/04/16 2325

## 2018-05-12 IMAGING — CR DG RIBS W/ CHEST 3+V*R*
4 series · 5 of 5 positions shown · non-contrast
Comparison: None.

CLINICAL DATA: Fell [DATE] week ago

EXAM:
RIGHT RIBS AND CHEST - 3+ VIEW

[chest pa]
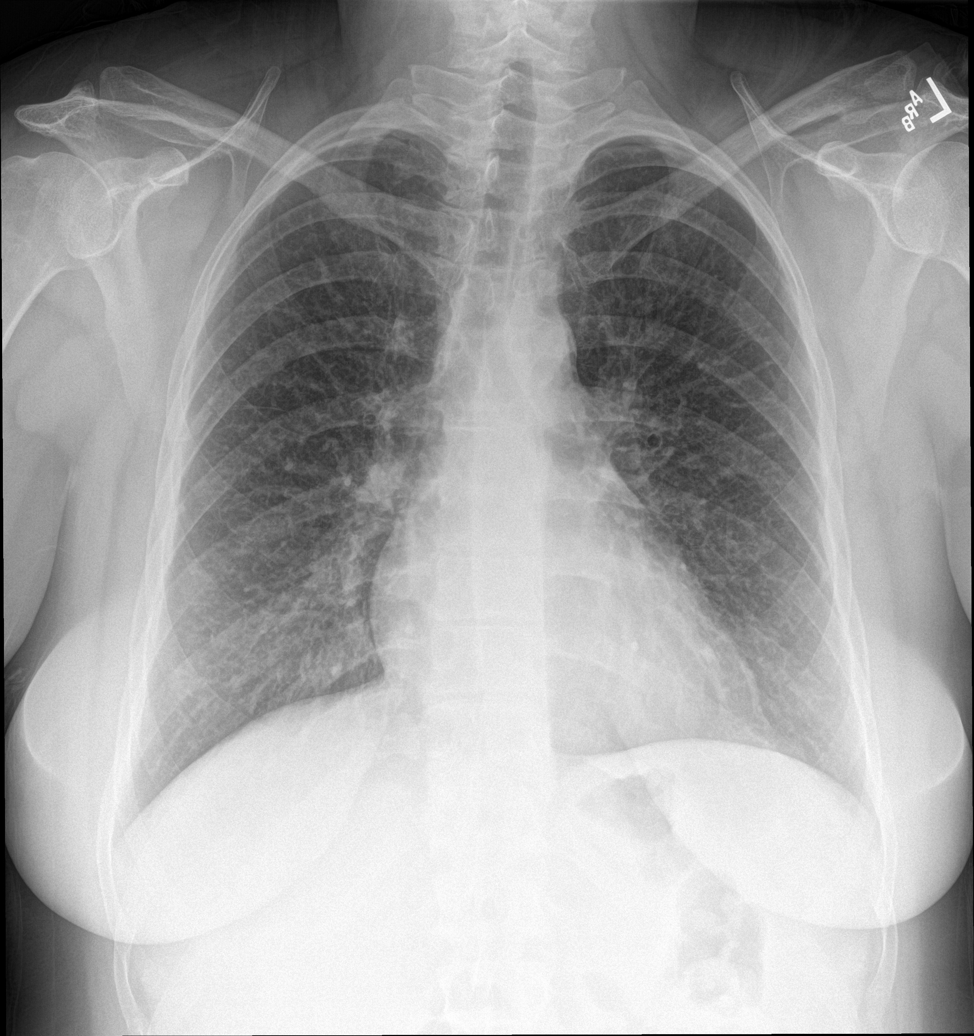

[rib pa]
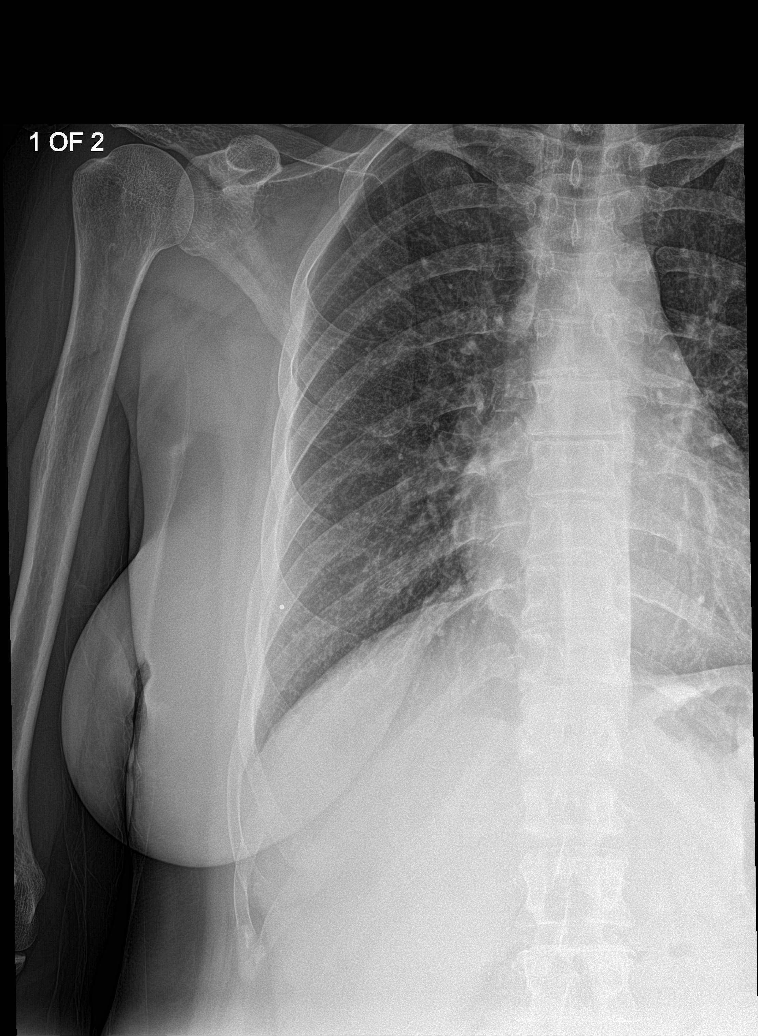

[rib ap]
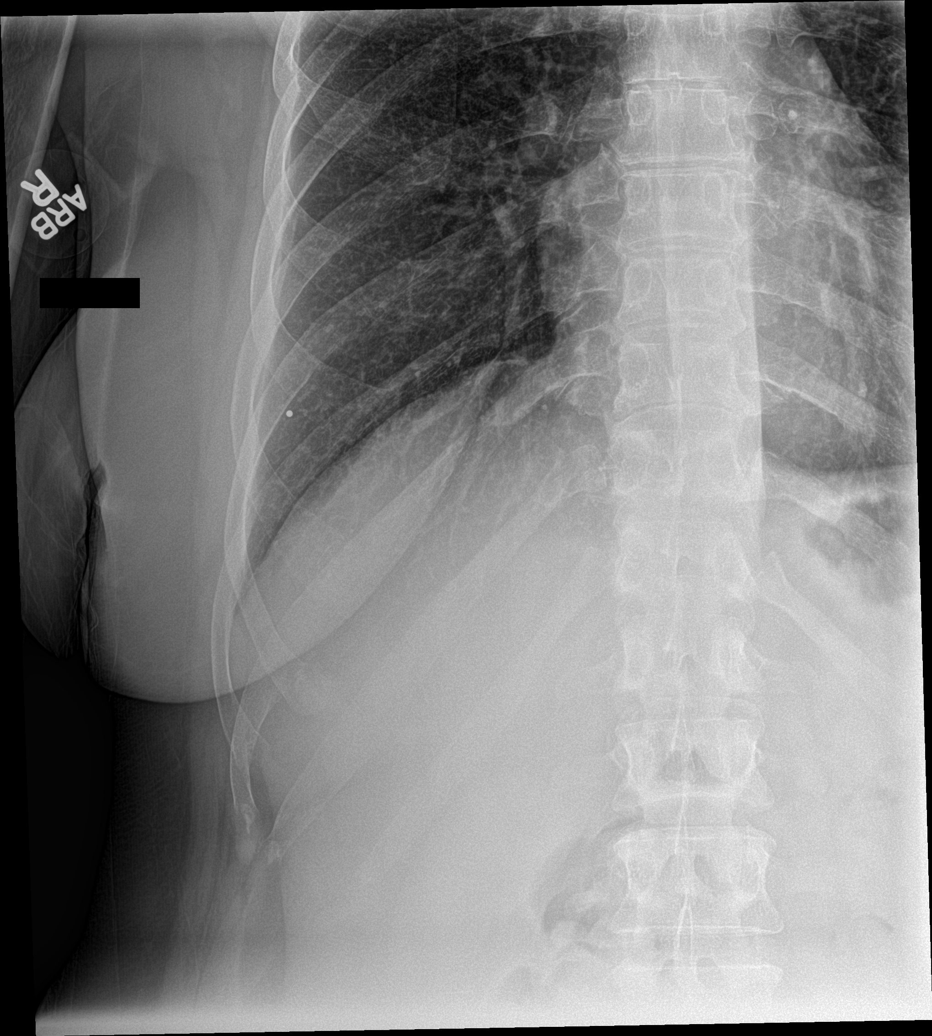

[Series 5: rib ap obl · 0.14mm/px · 2 of 2 slices shown]
[im 1/2]
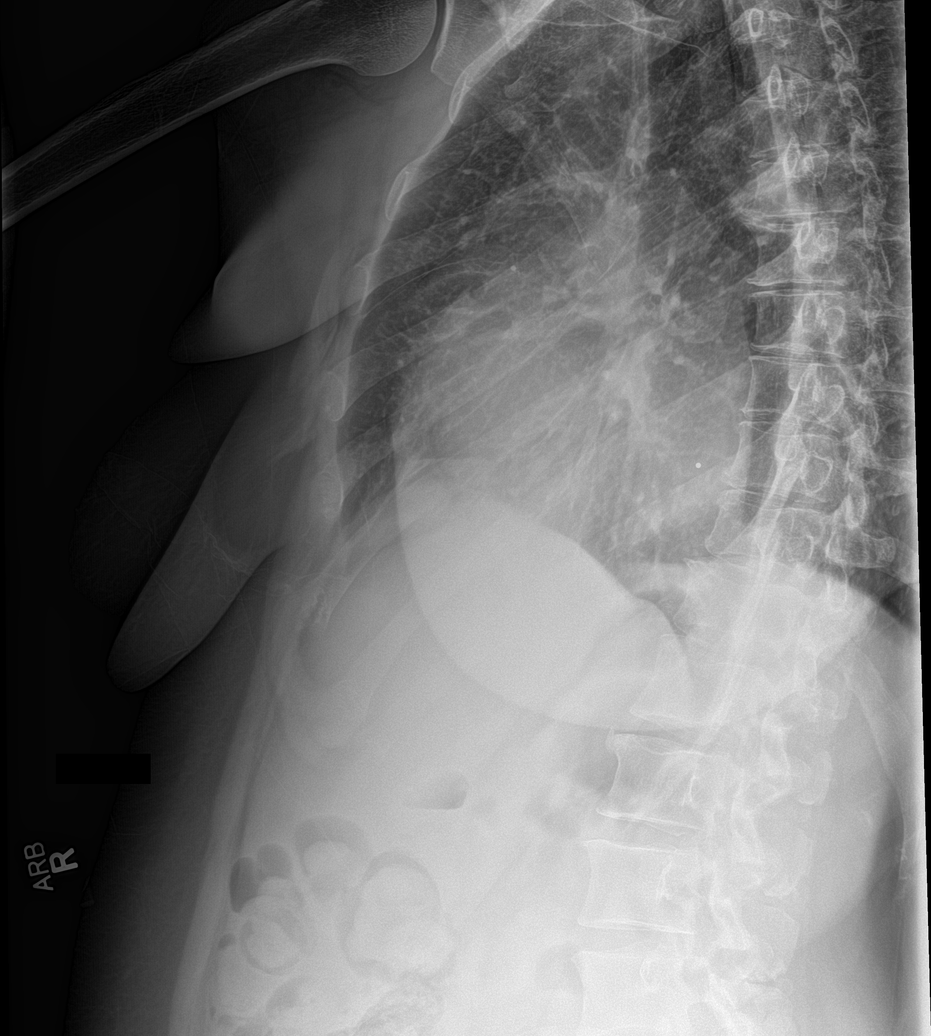
[im 2/2]
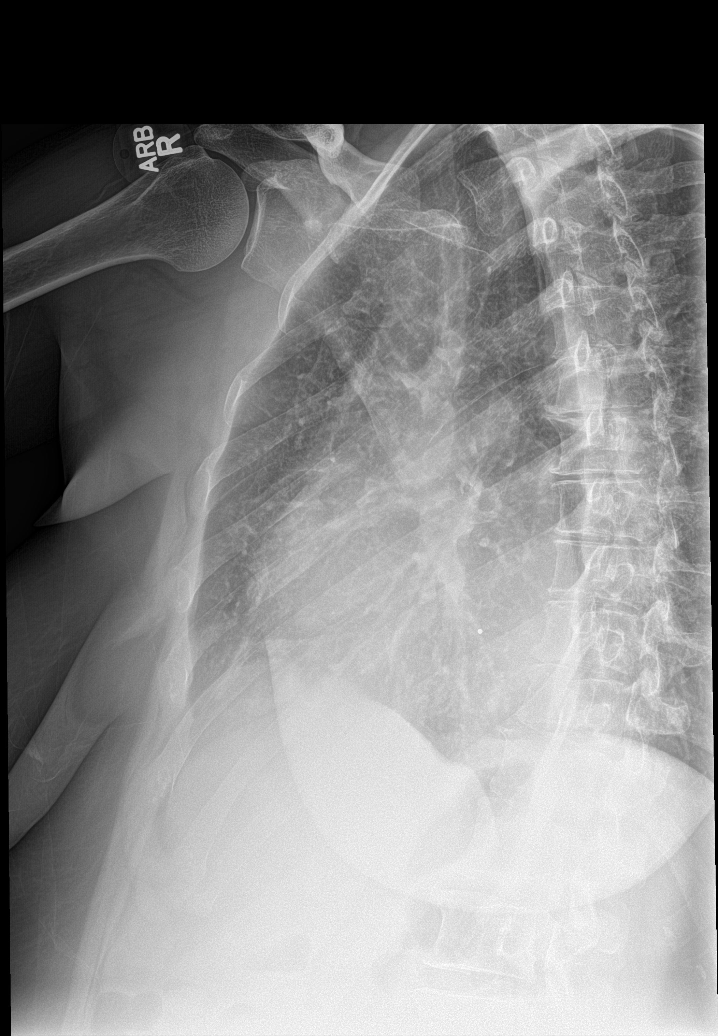

[5 of 5 positions shown; findings below may reference images not displayed]

FINDINGS: Five views right ribs submitted. No acute infiltrate or pulmonary
edema. No right rib fracture is identified. There is no
pneumothorax.
IMPRESSION: Negative.

## 2019-10-29 ENCOUNTER — Other Ambulatory Visit: Payer: Managed Care, Other (non HMO)

## 2019-12-01 ENCOUNTER — Other Ambulatory Visit: Payer: Managed Care, Other (non HMO)

## 2019-12-02 ENCOUNTER — Ambulatory Visit: Payer: Managed Care, Other (non HMO) | Attending: Internal Medicine

## 2019-12-02 DIAGNOSIS — Z20822 Contact with and (suspected) exposure to covid-19: Secondary | ICD-10-CM | POA: Insufficient documentation

## 2019-12-03 LAB — NOVEL CORONAVIRUS, NAA: SARS-CoV-2, NAA: NOT DETECTED

## 2019-12-15 ENCOUNTER — Other Ambulatory Visit: Payer: Self-pay

## 2019-12-15 ENCOUNTER — Ambulatory Visit: Payer: Managed Care, Other (non HMO) | Attending: Internal Medicine

## 2019-12-15 DIAGNOSIS — Z20822 Contact with and (suspected) exposure to covid-19: Secondary | ICD-10-CM

## 2019-12-16 LAB — NOVEL CORONAVIRUS, NAA: SARS-CoV-2, NAA: NOT DETECTED

## 2020-06-03 DIAGNOSIS — J329 Chronic sinusitis, unspecified: Secondary | ICD-10-CM

## 2020-06-03 HISTORY — DX: Chronic sinusitis, unspecified: J32.9

## 2020-06-09 ENCOUNTER — Other Ambulatory Visit: Payer: Self-pay | Admitting: Podiatry

## 2020-06-10 ENCOUNTER — Other Ambulatory Visit: Payer: Self-pay

## 2020-06-10 ENCOUNTER — Encounter: Payer: Self-pay | Admitting: Podiatry

## 2020-06-15 ENCOUNTER — Other Ambulatory Visit: Payer: Managed Care, Other (non HMO)

## 2020-06-18 ENCOUNTER — Other Ambulatory Visit: Payer: Self-pay

## 2020-06-18 ENCOUNTER — Other Ambulatory Visit
Admission: RE | Admit: 2020-06-18 | Discharge: 2020-06-18 | Disposition: A | Discharge status: Home / Self Care | Payer: Managed Care, Other (non HMO) | Source: Ambulatory Visit | Attending: Podiatry | Admitting: Podiatry

## 2020-06-18 DIAGNOSIS — Z01812 Encounter for preprocedural laboratory examination: Secondary | ICD-10-CM | POA: Insufficient documentation

## 2020-06-18 DIAGNOSIS — Z20822 Contact with and (suspected) exposure to covid-19: Secondary | ICD-10-CM | POA: Insufficient documentation

## 2020-06-18 LAB — SARS CORONAVIRUS 2 (TAT 6-24 HRS): SARS Coronavirus 2: NEGATIVE

## 2020-06-21 NOTE — Discharge Instructions (Signed)
[facility]  POST OPERATIVE INSTRUCTIONS FOR DR. TROXLER, DR. FOWLER, AND DR. [facility]   1. Take your medication as prescribed.  Pain medication should be taken only as needed.  2. Keep the dressing clean, dry and intact.  3. Keep your foot elevated above the heart level for the first 48 hours.  4. Walking to the bathroom and brief periods of walking are acceptable, unless we have instructed you to be non-weight bearing.  5. Always wear your post-op shoe when walking.  Always use your crutches if you are to be non-weight bearing.  6. Do not take a shower. Baths are permissible as long as the foot is kept out of the water.   7. Every hour you are awake:  - Bend your knee 15 times. - Flex foot 15 times - Massage calf 15 times  8. Call [facility] ([phone]) if any of the following problems occur: - You develop a temperature or fever. - The bandage becomes saturated with blood. - Medication does not stop your pain. - Injury of the foot occurs. - Any symptoms of infection including redness, odor, or red streaks running from wound.   General Anesthesia, Adult, Care After This sheet gives you information about how to care for yourself after your procedure. Your health care provider may also give you more specific instructions. If you have problems or questions, contact your health care provider. What can I expect after the procedure? After the procedure, the following side effects are common:  Pain or discomfort at the IV site.  Nausea.  Vomiting.  Sore throat.  Trouble concentrating.  Feeling cold or chills.  Weak or tired.  Sleepiness and fatigue.  Soreness and body aches. These side effects can affect parts of the body that were not involved in surgery. Follow these instructions at home:  For at least 24 hours after the procedure:  Have a responsible adult stay with you. It is  important to have someone help care for you until you are awake and alert.  Rest as needed.  Do not: ? Participate in activities in which you could fall or become injured. ? Drive. ? Use heavy machinery. ? Drink alcohol. ? Take sleeping pills or medicines that cause drowsiness. ? Make important decisions or sign legal documents. ? Take care of children on your own. Eating and drinking  Follow any instructions from your health care provider about eating or drinking restrictions.  When you feel hungry, start by eating small amounts of foods that are soft and easy to digest (bland), such as toast. Gradually return to your regular diet.  Drink enough fluid to keep your urine pale yellow.  If you vomit, rehydrate by drinking water, juice, or clear broth. General instructions  If you have sleep apnea, surgery and certain medicines can increase your risk for breathing problems. Follow instructions from your health care provider about wearing your sleep device: ? Anytime you are sleeping, including during daytime naps. ? While taking prescription pain medicines, sleeping medicines, or medicines that make you drowsy.  Return to your normal activities as told by your health care provider. Ask your health care provider what activities are safe for you.  Take over-the-counter and prescription medicines only as told by your health care provider.  If you smoke, do not smoke without supervision.  Keep all follow-up visits as told by your health care provider. This is important. Contact a health care provider if:    You have nausea or vomiting that does not get better with medicine.  You cannot eat or drink without vomiting.  You have pain that does not get better with medicine.  You are unable to pass urine.  You develop a skin rash.  You have a fever.  You have redness around your IV site that gets worse. Get help right away if:  You have difficulty breathing.  You have chest  pain.  You have blood in your urine or stool, or you vomit blood. Summary  After the procedure, it is common to have a sore throat or nausea. It is also common to feel tired.  Have a responsible adult stay with you for the first 24 hours after general anesthesia. It is important to have someone help care for you until you are awake and alert.  When you feel hungry, start by eating small amounts of foods that are soft and easy to digest (bland), such as toast. Gradually return to your regular diet.  Drink enough fluid to keep your urine pale yellow.  Return to your normal activities as told by your health care provider. Ask your health care provider what activities are safe for you. This information is not intended to replace advice given to you by your health care provider. Make sure you discuss any questions you have with your health care provider. Document Revised: 10/12/2017 Document Reviewed: 05/25/2017 Elsevier Patient Education  2020 [employer]  

## 2020-06-22 ENCOUNTER — Ambulatory Visit
Admission: RE | Admit: 2020-06-22 | Discharge: 2020-06-22 | Disposition: A | Discharge status: Home / Self Care | Payer: Managed Care, Other (non HMO) | Attending: Podiatry | Admitting: Podiatry

## 2020-06-22 ENCOUNTER — Other Ambulatory Visit: Payer: Self-pay

## 2020-06-22 ENCOUNTER — Encounter: Payer: Self-pay | Admitting: Podiatry

## 2020-06-22 ENCOUNTER — Encounter
Admission: RE | Disposition: A | Discharge status: Home / Self Care | Payer: Self-pay | Source: Home / Self Care | Attending: Podiatry

## 2020-06-22 ENCOUNTER — Ambulatory Visit: Payer: Managed Care, Other (non HMO) | Admitting: Anesthesiology

## 2020-06-22 DIAGNOSIS — G8929 Other chronic pain: Secondary | ICD-10-CM | POA: Insufficient documentation

## 2020-06-22 DIAGNOSIS — M2041 Other hammer toe(s) (acquired), right foot: Secondary | ICD-10-CM | POA: Diagnosis not present

## 2020-06-22 DIAGNOSIS — M216X1 Other acquired deformities of right foot: Secondary | ICD-10-CM | POA: Diagnosis not present

## 2020-06-22 DIAGNOSIS — Z791 Long term (current) use of non-steroidal anti-inflammatories (NSAID): Secondary | ICD-10-CM | POA: Diagnosis not present

## 2020-06-22 DIAGNOSIS — Z91040 Latex allergy status: Secondary | ICD-10-CM | POA: Diagnosis not present

## 2020-06-22 DIAGNOSIS — F17210 Nicotine dependence, cigarettes, uncomplicated: Secondary | ICD-10-CM | POA: Diagnosis not present

## 2020-06-22 DIAGNOSIS — L4050 Arthropathic psoriasis, unspecified: Secondary | ICD-10-CM | POA: Diagnosis not present

## 2020-06-22 DIAGNOSIS — Z793 Long term (current) use of hormonal contraceptives: Secondary | ICD-10-CM | POA: Diagnosis not present

## 2020-06-22 DIAGNOSIS — Z91048 Other nonmedicinal substance allergy status: Secondary | ICD-10-CM | POA: Insufficient documentation

## 2020-06-22 DIAGNOSIS — Z79899 Other long term (current) drug therapy: Secondary | ICD-10-CM | POA: Diagnosis not present

## 2020-06-22 HISTORY — PX: HAMMER TOE SURGERY: SHX385

## 2020-06-22 HISTORY — DX: Pain in unspecified foot: M79.673

## 2020-06-22 HISTORY — PX: OSTECTOMY: SHX6439

## 2020-06-22 HISTORY — DX: Motion sickness, initial encounter: T75.3XXA

## 2020-06-22 LAB — POCT PREGNANCY, URINE: Preg Test, Ur: NEGATIVE

## 2020-06-22 SURGERY — OSTECTOMY
Anesthesia: General | Site: Toe | Laterality: Right

## 2020-06-22 MED ORDER — MIDAZOLAM HCL 5 MG/5ML IJ SOLN
INTRAMUSCULAR | Status: DC | PRN
  Administered 2020-06-22: 2 mg via INTRAVENOUS

## 2020-06-22 MED ORDER — FENTANYL CITRATE (PF) 100 MCG/2ML IJ SOLN
INTRAMUSCULAR | Status: DC | PRN
Start: 2020-06-22 — End: 2020-06-22
  Administered 2020-06-22: 100 ug via INTRAVENOUS

## 2020-06-22 MED ORDER — LIDOCAINE HCL (CARDIAC) PF 100 MG/5ML IV SOSY
PREFILLED_SYRINGE | INTRAVENOUS | Status: DC | PRN
  Administered 2020-06-22: 30 mg via INTRATRACHEAL

## 2020-06-22 MED ORDER — ROPIVACAINE HCL 5 MG/ML IJ SOLN
INTRAMUSCULAR | Status: DC | PRN
  Administered 2020-06-22: 40 mL via PERINEURAL

## 2020-06-22 MED ORDER — FENTANYL CITRATE (PF) 100 MCG/2ML IJ SOLN: 25.0000 ug | INTRAMUSCULAR | Status: DC | PRN

## 2020-06-22 MED ORDER — PROPOFOL 10 MG/ML IV BOLUS
INTRAVENOUS | Status: DC | PRN
  Administered 2020-06-22: 170 mg via INTRAVENOUS

## 2020-06-22 MED ORDER — POVIDONE-IODINE 7.5 % EX SOLN: Freq: Once | CUTANEOUS | Status: AC

## 2020-06-22 MED ORDER — LACTATED RINGERS IV SOLN: INTRAVENOUS | Status: DC

## 2020-06-22 MED ORDER — OXYCODONE HCL 5 MG/5ML PO SOLN: 5.0000 mg | Freq: Once | ORAL | Status: DC | PRN

## 2020-06-22 MED ORDER — BUPIVACAINE LIPOSOME 1.3 % IJ SUSP
INTRAMUSCULAR | Status: DC | PRN
  Administered 2020-06-22: 10 mL

## 2020-06-22 MED ORDER — ONDANSETRON HCL 4 MG/2ML IJ SOLN
INTRAMUSCULAR | Status: DC | PRN
  Administered 2020-06-22: 4 mg via INTRAVENOUS

## 2020-06-22 MED ORDER — OXYCODONE HCL 5 MG PO TABS: 5.0000 mg | ORAL_TABLET | Freq: Once | ORAL | Status: DC | PRN

## 2020-06-22 MED ORDER — ENOXAPARIN SODIUM 40 MG/0.4ML ~~LOC~~ SOLN: 40.0000 mg | SUBCUTANEOUS | 1 refills | Status: AC

## 2020-06-22 MED ORDER — ONDANSETRON HCL 4 MG/2ML IJ SOLN: 4.0000 mg | Freq: Once | INTRAMUSCULAR | Status: DC | PRN

## 2020-06-22 MED ORDER — ACETAMINOPHEN 325 MG PO TABS: 325.0000 mg | ORAL_TABLET | ORAL | Status: DC | PRN

## 2020-06-22 MED ORDER — OXYCODONE-ACETAMINOPHEN 7.5-325 MG PO TABS: 1.0000 | ORAL_TABLET | ORAL | 0 refills | Status: AC | PRN

## 2020-06-22 MED ORDER — DEXAMETHASONE SODIUM PHOSPHATE 4 MG/ML IJ SOLN
INTRAMUSCULAR | Status: DC | PRN
  Administered 2020-06-22: 4 mg via INTRAVENOUS

## 2020-06-22 MED ORDER — ACETAMINOPHEN 160 MG/5ML PO SOLN: 325.0000 mg | ORAL | Status: DC | PRN

## 2020-06-22 MED ORDER — GLYCOPYRROLATE 0.2 MG/ML IJ SOLN
INTRAMUSCULAR | Status: DC | PRN
  Administered 2020-06-22: .1 mg via INTRAVENOUS

## 2020-06-22 MED ORDER — CEFAZOLIN SODIUM-DEXTROSE 2-4 GM/100ML-% IV SOLN
2.0000 g | INTRAVENOUS | Status: AC
  Administered 2020-06-22: 2 g via INTRAVENOUS

## 2020-06-22 SURGICAL SUPPLY — 60 items
APL SKNCLS STERI-STRIP NONHPOA (GAUZE/BANDAGES/DRESSINGS) ×1
BENZOIN TINCTURE PRP APPL 2/3 (GAUZE/BANDAGES/DRESSINGS) ×3 IMPLANT
BIT DRILL CANN 1.7 (BIT) ×3 IMPLANT
BLADE MINI RND TIP GREEN BEAV (BLADE) ×12 IMPLANT
BLADE OSC/SAGITTAL MD 5.5X18 (BLADE) ×3 IMPLANT
BLADE OSCILLATING/SAGITTAL (BLADE) ×3
BLADE SW THK.38XMED LNG THN (BLADE) ×1 IMPLANT
BNDG CMPR 75X41 PLY HI ABS (GAUZE/BANDAGES/DRESSINGS) ×2
BNDG ELASTIC 4X5.8 VLCR STR LF (GAUZE/BANDAGES/DRESSINGS) ×3 IMPLANT
BNDG ESMARK 4X12 TAN STRL LF (GAUZE/BANDAGES/DRESSINGS) ×3 IMPLANT
BNDG GAUZE 4.5X4.1 6PLY STRL (MISCELLANEOUS) ×6 IMPLANT
BNDG STRETCH 4X75 STRL LF (GAUZE/BANDAGES/DRESSINGS) ×6 IMPLANT
CANISTER SUCT 1200ML W/VALVE (MISCELLANEOUS) ×3 IMPLANT
CLOSURE WOUND 1/4X4 (GAUZE/BANDAGES/DRESSINGS) ×1
CNTRSNK DRL 2.5X2X (MISCELLANEOUS) ×1 IMPLANT
COUNTERSINK 2.5 (MISCELLANEOUS) ×3
COVER LIGHT HANDLE UNIVERSAL (MISCELLANEOUS) ×6 IMPLANT
COVER PIN YLW 0.028-062 (MISCELLANEOUS) ×12 IMPLANT
CUFF TOURN SGL QUICK 18X4 (TOURNIQUET CUFF) ×3 IMPLANT
DRAPE FLUOR MINI C-ARM 54X84 (DRAPES) ×3 IMPLANT
DURAPREP 26ML APPLICATOR (WOUND CARE) ×3 IMPLANT
ELECT REM PT RETURN 9FT ADLT (ELECTROSURGICAL) ×3
ELECTRODE REM PT RTRN 9FT ADLT (ELECTROSURGICAL) ×1 IMPLANT
ETHIBOND 2 0 GREEN CT 2 30IN (SUTURE) ×6 IMPLANT
GAUZE SPONGE 4X4 12PLY STRL (GAUZE/BANDAGES/DRESSINGS) ×3 IMPLANT
GAUZE XEROFORM 1X8 LF (GAUZE/BANDAGES/DRESSINGS) ×6 IMPLANT
GLOVE BIO SURGEON STRL SZ8 (GLOVE) ×3 IMPLANT
GOWN STRL REUS W/ TWL LRG LVL3 (GOWN DISPOSABLE) ×2 IMPLANT
GOWN STRL REUS W/ TWL XL LVL3 (GOWN DISPOSABLE) ×1 IMPLANT
GOWN STRL REUS W/TWL LRG LVL3 (GOWN DISPOSABLE) ×6
GOWN STRL REUS W/TWL XL LVL3 (GOWN DISPOSABLE) ×3
GUIDEWIRE .9 (WIRE) ×18 IMPLANT
K-WIRE DBL END TROCAR 6X.045 (WIRE) ×3
KIT TURNOVER KIT A (KITS) ×3 IMPLANT
KWIRE DBL END TROCAR 6X.045 (WIRE) ×1 IMPLANT
NS IRRIG 500ML POUR BTL (IV SOLUTION) ×3 IMPLANT
PACK EXTREMITY ARMC (MISCELLANEOUS) ×3 IMPLANT
PAD CAST 4YDX4 CTTN HI CHSV (CAST SUPPLIES) ×1 IMPLANT
PADDING CAST 4IN STRL (MISCELLANEOUS) ×2
PADDING CAST BLEND 4X4 STRL (MISCELLANEOUS) ×1 IMPLANT
PADDING CAST COTTON 4X4 STRL (CAST SUPPLIES) ×3
PENCIL SMOKE EVACUATOR (MISCELLANEOUS) ×3 IMPLANT
SCREW CANN 10X2 CLR CD FT (Screw) ×1 IMPLANT
SCREW CANN HEADED 2.0X10 (Screw) ×3 IMPLANT
SCREW CANN HEADLESS 2.5X10 (Screw) ×3 IMPLANT
SCREW CANN HEADLESS 2.5X12 (Screw) ×6 IMPLANT
SCREW HEAD 12X2XCANN CLR CD FT (Screw) ×2 IMPLANT
SCREW HEADED 2.0X12 (Screw) ×6 IMPLANT
SPLINT CAST 1 STEP 4X30 (MISCELLANEOUS) ×3 IMPLANT
SPLINT FAST PLASTER 5X30 (CAST SUPPLIES) ×2
SPLINT PLASTER CAST FAST 5X30 (CAST SUPPLIES) ×1 IMPLANT
STOCKINETTE STRL 6IN 960660 (GAUZE/BANDAGES/DRESSINGS) ×3 IMPLANT
STRAP BODY AND KNEE 60X3 (MISCELLANEOUS) ×3 IMPLANT
STRIP CLOSURE SKIN 1/4X4 (GAUZE/BANDAGES/DRESSINGS) ×2 IMPLANT
SUT ETHILON 4-0 (SUTURE) ×6
SUT ETHILON 4-0 FS2 18XMFL BLK (SUTURE) ×2
SUT VIC AB 4-0 SH 27 (SUTURE) ×9
SUT VIC AB 4-0 SH 27XANBCTRL (SUTURE) ×3 IMPLANT
SUTURE ETHLN 4-0 FS2 18XMF BLK (SUTURE) ×2 IMPLANT
WIRE 1.25MMX150MM ×9 IMPLANT

## 2020-06-22 NOTE — Anesthesia Procedure Notes (Signed)
Procedure Name: LMA Insertion Date/Time: 06/22/2020 9:25 AM Performed by: Maree Krabbe, CRNA Pre-anesthesia Checklist: Patient identified, Emergency Drugs available, Suction available, Timeout performed and Patient being monitored Patient Re-evaluated:Patient Re-evaluated prior to induction Oxygen Delivery Method: Circle system utilized Preoxygenation: Pre-oxygenation with 100% oxygen Induction Type: IV induction LMA: LMA inserted LMA Size: 4.0 Number of attempts: 1 Placement Confirmation: positive ETCO2 and breath sounds checked- equal and bilateral Tube secured with: Tape Dental Injury: Teeth and Oropharynx as per pre-operative assessment

## 2020-06-22 NOTE — H&P (Signed)
H and P has been reviewed and no changes are noted. I explained to the patient that the surgicval grade stainless steel pins we use have a high grade of nickel.  She consented to use these.

## 2020-06-22 NOTE — Anesthesia Postprocedure Evaluation (Signed)
Anesthesia Post Note  Patient: Kim Middleton  Procedure(s) Performed: PHALANX OSTECTOMY 2,3,4,5 (Right Toe) HAMMER TOE CORRECTION 2,3,4,5 (Right Toe)     Patient location during evaluation: PACU Anesthesia Type: General Level of consciousness: awake and alert and oriented Pain management: satisfactory to patient Vital Signs Assessment: post-procedure vital signs reviewed and stable Respiratory status: spontaneous breathing, nonlabored ventilation and respiratory function stable Cardiovascular status: blood pressure returned to baseline and stable Postop Assessment: Adequate PO intake and No signs of nausea or vomiting Anesthetic complications: no   No complications documented.  Cherly Beach

## 2020-06-22 NOTE — Anesthesia Preprocedure Evaluation (Addendum)
Anesthesia Evaluation  Patient identified by MRN, date of birth, ID band Patient awake    Reviewed: Allergy & Precautions, H&P , NPO status , Patient's Chart, lab work & pertinent test results  Airway Mallampati: II  TM Distance: >3 FB Neck ROM: full    Dental no notable dental hx.    Pulmonary Current Smoker and Patient abstained from smoking.,    Pulmonary exam normal breath sounds clear to auscultation       Cardiovascular Normal cardiovascular exam Rhythm:regular Rate:Normal     Neuro/Psych    GI/Hepatic   Endo/Other    Renal/GU      Musculoskeletal   Abdominal   Peds  Hematology   Anesthesia Other Findings   Reproductive/Obstetrics                             Anesthesia Physical Anesthesia Plan  ASA: II  Anesthesia Plan: General LMA   Post-op Pain Management:  Regional for Post-op pain   Induction:   PONV Risk Score and Plan: 2 and Treatment may vary due to age or medical condition, Ondansetron and Dexamethasone  Airway Management Planned:   Additional Equipment:   Intra-op Plan:   Post-operative Plan:   Informed Consent: I have reviewed the patients History and Physical, chart, labs and discussed the procedure including the risks, benefits and alternatives for the proposed anesthesia with the patient or authorized representative who has indicated his/her understanding and acceptance.     Dental Advisory Given  Plan Discussed with: CRNA  Anesthesia Plan Comments:        Anesthesia Quick Evaluation

## 2020-06-22 NOTE — Transfer of Care (Signed)
Immediate Anesthesia Transfer of Care Note  Patient: [name]  Procedure(s) Performed: PHALANX OSTECTOMY 2,3,4,5 (Right Toe) HAMMER TOE CORRECTION 2,3,4,5 (Right Toe)  Patient Location: PACU  Anesthesia Type: General LMA  Level of Consciousness: awake, alert  and patient cooperative  Airway and Oxygen Therapy: Patient Spontanous Breathing and Patient connected to supplemental oxygen  Post-op Assessment: Post-op Vital signs reviewed, Patient's Cardiovascular Status Stable, Respiratory Function Stable, Patent Airway and No signs of Nausea or vomiting  Post-op Vital Signs: Reviewed and stable  Complications: No complications documented.

## 2020-06-22 NOTE — Op Note (Signed)
Operative note   Surgeon: Dr. Recardo Evangelist, DPM.    Assistant: Dr. Rosetta Posner    Preop diagnosis: 1.  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  Psoriatic arthritis    Postop diagnosis: Same    Procedure:   1.  Osteotomy second, third, fourth, and fifth metatarsals right foot   2.  Hammertoe repairs with PIP joint fusion and extensor tendon transfer to the second third and fourth toes of the right foot   3.  Arthroplasty with phalanx head resection fifth toe right foot     EBL: Approximately 10 mL    Anesthesia:general delivered by the anesthesia team along with a popliteal block delivered by anesthesia team.  I injected 10 mL of Exparel at the end of the case throughout the operative sites.    Hemostasis: Ankle tourniquet at 225 mils mercury pressure initially for 83 minutes it was released for 10 minutes and then reinflated for 81 minutes    Specimen: None    Complications: None    Operative indications: Chronic pain and deformity to the right foot.  This is resistant to conservative care and worsened by her psoriatic arthritis.    Procedure:  Patient was brought into the OR and placed on the operating table in thesupine position. After anesthesia was obtained theright lower extremity was prepped and draped in usual sterile fashion.  Operative Report: Attention was directed to the dorsum of the right foot over the second metatarsal metatarsophalangeal joint and second toe.  A 5 cm linear incision was made over this region deepened with sharp blunt dissection bleeders were clamped and bovied as required.  Extensor tendon was identified at this point and an extensor hood release was performed tendons retracted laterally.  Periosteal Tissue was then incised longitudinally and the dorsal capsule was freed with a transverse incision.  A McGlamry elevator was used to free up and break up scar tissue around the joint this  timeframe.  Once this was accomplished and osteotomies performed in the second metatarsal from dorsal distal plantar proximal and oblique fashion.  Metatarsal head was then shortened by 3 to 5 mm and then temporally fixated with K wires.  There is checked FluoroScan good position correction were noted.  This point a 2.5 mm headless screw from the [employer] set was placed across the osteotomy.  This was seen to hold the osteotomy in good position and appear to be stable both clinically and radiographically.  At this point the area was copiously irrigated and attention was directed to the second toe.  Second toe procedure: This time attention was directed to th PIP joint of the second toe where a linear incision made over the PIP joint.  This was deepened with sharp blunt dissection bleeders clamped bovied required.  This point extension was notified incised transversely reflected proximally and distally.  The articular cartilage on the proximal phalanx head was excised with power equipment as well as the base of the middle phalanx.  This point a longitudinal incision made to the plantar capsule and the long flexor tendon was identified isolated and released and then sutured together over the midshaft area of the dorsal aspect of the proximal phalanx to allow for better plantar flexion of the digit.  Once this was accomplished a titanium K wire was run through the middle distal phalanx and retrograded into the proximal phalanx and up the shaft of the metatarsal.  There is checked  FluoroScan good position and correction were noted.  After copious irrigation the extensor tendon over the PIP joint was sutured with 4-0 Vicryl in a simple interrupted sutures.  3 separate stitches were utilized.  Periosteal tissue and capsule tissue over the MTP joint was closed with 3-0 Vicryl in continuous stitch as were deep superficial fascial layers.  Skin to both areas were closed with 4-0 nylon horizontal mattress sutures.  Third  metatarsal procedure.  Prior to closure of the wound of the second metatarsal dissection was carried down to the third metatarsal where similar procedure was performed as that on the second metatarsal.  Third toe procedure: At this time a similar procedure was performed on the third toe of the right foot with fusion and flexor tendon transfer and K wire fixation  Fourth metatarsal procedure and fourth toe procedure: At this time similar procedures were performed on the fourth metatarsal for an osteotomy and also fourth toe PIP joint fusion with flexor tendon transfer and K wire fixation.  Titanium pins were used across the board.    Fifth metatarsal procedure: This time the fifth metatarsal was identified and a linear incision was made dorsal laterally over that joint.  The incision was deepened sharp dissection bleeders clamped bleeders required.  The extensor tendon was freed from its hood attachment with a lateral incision.  This time I elected to release the long extensor tendon because of the hyperextension deformity of the digit.  Once the Tissue was notified incised it was freed from the metatarsal head.  A McGlamry elevator was used to free up the capsular scar tissue.  The metatarsal head was then isolated and an osteotomy was performed from dorsal distal plantar proximal using 2 cuts.  The second cut was in a wedge fashion to allow for dorsiflexion of the metatarsal head as well as shortening of the metatarsal head.  This wedge was removed.  The osteotomy was reapproximated and temporarily fixated with 2 K wires from the 2.0 screw set.  2 screws were placed across the area and seem to fixate very well a FluoroScan was used to identify the osteotomy was stable with good fixation and good correction noted.  Fifth toe procedure: This time the a linear incision made over the PIP joint of the fifth toe incision was deepened sharp blunt dissection.  The extensor tendon overlying the PIP joint was  notified incised transversely reflected proximally.  Head of proximal phalanx was then resected with power equipment.  There is an copiously irrigated.  Extensor tendon was then closed with 3-0 Vicryl simple interrupted sutures.  A point of 5 K wire was run across the digit at this point percutaneously to hold the toe in a more rectus position.  Titanium K wires were used and all digits.  Screws were also titanium.  Other findings included significant arthritic degenerative changes to the PIP joints particularly of the third fourth and fifth toes.  This is likely secondary to her psoriatic arthritis.  At this time the areas were blocked with 10 mL of Exparel.  Sterile compressive dressing was then placed across wound assisting of Steri-Strips Xeroform gauze 4 x 4's Kling and Kerlix.  Tourniquet been released prior to skin closure and minimal bleeding was encountered.  A posterior splint was also placed on the right foot and leg.   Patient tolerated the procedure and anesthesia well.  Was transported from the OR to the PACU with all vital signs stable and vascular status intact. To be discharged  per routine protocol.  Will follow up in approximately 1 week in the outpatient clinic.

## 2020-06-22 NOTE — Anesthesia Procedure Notes (Signed)
Anesthesia Regional Block: Popliteal block   Pre-Anesthetic Checklist: ,, timeout performed, Correct Patient, Correct Site, Correct Laterality, Correct Procedure, Correct Position, site marked, Risks and benefits discussed,  Surgical consent,  Pre-op evaluation,  At surgeon's request and post-op pain management  Laterality: Right  Prep: chloraprep       Needles:  Injection technique: Single-shot  Needle Type: Echogenic Needle     Needle Length: 9cm  Needle Gauge: 21     Additional Needles:   Procedures:,,,, ultrasound used (permanent image in chart),,,,  Narrative:  Start time: 06/22/2020 9:02 AM End time: 06/22/2020 9:09 AM Injection made incrementally with aspirations every 5 mL.  Performed by: Personally  Anesthesiologist: Ranee Gosselin, MD  Additional Notes: Functioning IV was confirmed and monitors applied. Ultrasound guidance: relevant anatomy identified, needle position confirmed, local anesthetic spread visualized around nerve(s)., vascular puncture avoided.  Image printed for medical record.  Negative aspiration and no paresthesias; incremental administration of local anesthetic. The patient tolerated the procedure well. Vitals signes recorded in RN notes.  30 ml/

## 2020-06-22 NOTE — Anesthesia Procedure Notes (Signed)
Anesthesia Regional Block: Adductor canal block   Pre-Anesthetic Checklist: ,, timeout performed, Correct Patient, Correct Site, Correct Laterality, Correct Procedure, Correct Position, site marked, Risks and benefits discussed,  Surgical consent,  Pre-op evaluation,  At surgeon's request and post-op pain management  Laterality: Right  Prep: chloraprep       Needles:  Injection technique: Single-shot  Needle Type: Echogenic Needle     Needle Length: 9cm  Needle Gauge: 21     Additional Needles:   Procedures:,,,, ultrasound used (permanent image in chart),,,,  Narrative:  Start time: 06/22/2020 9:02 AM End time: 06/22/2020 9:09 AM Injection made incrementally with aspirations every 5 mL.  Performed by: Personally  Anesthesiologist: Ranee Gosselin, MD  Additional Notes: Functioning IV was confirmed and monitors applied. Ultrasound guidance: relevant anatomy identified, needle position confirmed, local anesthetic spread visualized around nerve(s)., vascular puncture avoided.  Image printed for medical record.  Negative aspiration and no paresthesias; incremental administration of local anesthetic. The patient tolerated the procedure well. Vitals signes recorded in RN notes.  10 ml

## 2021-07-25 ENCOUNTER — Encounter: Payer: Self-pay | Admitting: Cardiovascular Disease

## 2021-09-12 ENCOUNTER — Encounter: Payer: Self-pay | Admitting: Cardiovascular Disease

## 2021-09-12 ENCOUNTER — Ambulatory Visit (INDEPENDENT_AMBULATORY_CARE_PROVIDER_SITE_OTHER): Payer: Managed Care, Other (non HMO) | Admitting: Cardiovascular Disease

## 2021-09-12 ENCOUNTER — Other Ambulatory Visit: Payer: Self-pay

## 2021-09-12 ENCOUNTER — Ambulatory Visit (INDEPENDENT_AMBULATORY_CARE_PROVIDER_SITE_OTHER): Payer: Managed Care, Other (non HMO)

## 2021-09-12 VITALS — BP 138/80 | HR 83 | Ht 67.0 in | Wt 184.4 lb

## 2021-09-12 DIAGNOSIS — I479 Paroxysmal tachycardia, unspecified: Secondary | ICD-10-CM | POA: Diagnosis not present

## 2021-09-12 DIAGNOSIS — I951 Orthostatic hypotension: Secondary | ICD-10-CM

## 2021-09-12 DIAGNOSIS — R9431 Abnormal electrocardiogram [ECG] [EKG]: Secondary | ICD-10-CM

## 2021-09-12 DIAGNOSIS — I456 Pre-excitation syndrome: Secondary | ICD-10-CM

## 2021-09-12 NOTE — Patient Instructions (Addendum)
Medication Instructions:  No changes  If you need a refill on your cardiac medications before your next appointment, please call your pharmacy.   Lab work: No new labs needed  Testing/Procedures: Heart monitor (Zio patch) for 2 weeks (14 days)   We will call with the results   Your physician has recommended that you wear a Zio monitor. This monitor is a medical device that records the heart's electrical activity. Doctors most often use these monitors to diagnose arrhythmias. Arrhythmias are problems with the speed or rhythm of the heartbeat. The monitor is a small device applied to your chest. You can wear one while you do your normal daily activities. While wearing this monitor if you have any symptoms to push the button and record what you felt. Once you have worn this monitor for the period of time provider prescribed (Usually 14 days), you will return the monitor device in the postage paid box. Once it is returned they will download the data collected and provide [place] with a report which the provider will then review and we will call you with those results. Important tips:  Avoid showering during the first 24 hours of wearing the monitor. Avoid excessive sweating to help maximize wear time. Do not submerge the device, no hot tubs, and no swimming pools. Keep any lotions or oils away from the patch. After 24 hours you may shower with the patch on. Take brief showers with your back facing the shower head.  Do not remove patch once it has been placed because that will interrupt data and decrease adhesive wear time. Push the button when you have any symptoms and write down what you were feeling. Once you have completed wearing your monitor, remove and place into box which has postage paid and place in your outgoing mailbox.  If for some reason you have misplaced your box then call our office and we can provide another box and/or mail it off for you.  Follow-Up: At [facility], you and your  health needs are our priority.  As part of our continuing mission to provide you with exceptional heart care, we have created designated Provider Care Teams.  These Care Teams include your primary Cardiologist (physician) and Advanced Practice Providers (APPs -  Physician Assistants and Nurse Practitioners) who all work together to provide you with the care you need, when you need it.  You will need a follow up appointment as needed  Providers on your designated Care Team:   Nicolasa Ducking, NP Eula Listen, PA-C Cadence Fransico Michael, [place]  COVID-19 Vaccine Information can be found at: [email or web address] For questions related to vaccine distribution or appointments, please email [email or web address] or call [phone].

## 2021-09-12 NOTE — Progress Notes (Signed)
Cardiology Office Note  Date:  09/12/2021   ID:  Kim Middleton, DOB [birth year], MRN [id number]  PCP:  Alease Medina, MD   Chief Complaint  Patient presents with   New Patient (Initial Visit)    Ref by Dr. Girtha Rm for abnormal EKG. Patient c/o dizziness and rapid heartbeats. Medications reviewed by the patient verbally.     HPI:  Kim Middleton is a 40 year old woman with past medical history of Psoriatic arthritis/psoriasis Hyperlipidemia Who presents by referral from Dr. Cheri Kearns for consultation of her short PR on EKG, palpitations  Episodes of tachycardia, started in May 2022 Happening more since that time, Also with associated symptoms of SOB, dizziness  Describes episodes as: Visit to the emergency room Episode of room spinning, dizziness, chest pressure, shortness of breath, upper and lower back pain with inspiration in April 2022, seen in [facility] ER  Otherwise active at baseline, works at [employer] No chest pain, no edema no PND orthopnea  EKG personally reviewed by myself on todays visit Nsr rate 83 bpm no st changes, PR interval 114 ms  Review of EKGs from [facility] detailing short PR interval We have requested EKG from primary care, not in the system  PMH:   has a past medical history of Arthritis, Foot pain, Motion sickness, and Sinus infection (06/03/2020).  PSH:    Past Surgical History:  Procedure Laterality Date   Carpal Tunnel Right 01/29/2015   HAMMER TOE SURGERY Right 06/22/2020   Procedure: HAMMER TOE CORRECTION 2,3,4,5;  Surgeon: Recardo Evangelist, DPM;  Location: [facility];  Service: Podiatry;  Laterality: Right;   OSTECTOMY Right 06/22/2020   Procedure: PHALANX OSTECTOMY 2,3,4,5;  Surgeon: Recardo Evangelist, DPM;  Location: [facility];  Service: Podiatry;  Laterality: Right;  LMA WITH POP BLOCK   TONSILLECTOMY      Current Outpatient Medications  Medication Sig Dispense Refill   clobetasol cream (TEMOVATE) 0.05 %  clobetasol 0.05 % topical cream     gabapentin (NEURONTIN) 300 MG capsule Take 300 mg by mouth 3 (three) times daily.     ibuprofen (ADVIL) 800 MG tablet Take 800 mg by mouth every 6 (six) hours as needed.     medroxyPROGESTERone Acetate 150 MG/ML SUSY medroxyprogesterone 150 mg/mL intramuscular syringe     TALTZ 80 MG/ML SOAJ Inject into the skin.     enoxaparin (LOVENOX) 40 MG/0.4ML injection Inject 0.4 mLs (40 mg total) into the skin daily for 20 days. (Patient not taking: Reported on 09/12/2021) 8 mL 1   ibuprofen (ADVIL) 200 MG tablet Take 200 mg by mouth every 6 (six) hours as needed. (Patient not taking: Reported on 09/12/2021)     oxyCODONE-acetaminophen (PERCOCET) 7.5-325 MG tablet Take 1 tablet by mouth every 4 (four) hours as needed for severe pain. (Patient not taking: Reported on 09/12/2021) 30 tablet 0   No current facility-administered medications for this visit.     Allergies:   Nickel, Other, and Latex   Social History:  The patient  reports that she has been smoking cigarettes. She has a 15.00 pack-year smoking history. She has never used smokeless tobacco. She reports that she does not currently use alcohol. She reports that she does not use drugs.   Family History:   family history includes Depression in her mother; Gout in her father; Heart disease in her mother; Hypertension in her mother; Leukemia in her brother; Stroke in her mother.    Review of Systems: Review of Systems  Constitutional:  Negative.   HENT: Negative.    Respiratory: Negative.    Cardiovascular:  Positive for palpitations.  Gastrointestinal: Negative.   Musculoskeletal: Negative.   Neurological: Negative.   Psychiatric/Behavioral: Negative.    All other systems reviewed and are negative.   PHYSICAL EXAM: VS:  BP 138/80 (BP Location: Right Arm, Patient Position: Sitting, Cuff Size: Normal)   Pulse 83   Ht 5\' 7"  (1.702 m)   Wt 184 lb 6 oz (83.6 kg)   SpO2 98%   BMI 28.88 kg/m  , BMI Body  mass index is 28.88 kg/m. GEN: Well nourished, well developed, in no acute distress HEENT: normal Neck: no JVD, carotid bruits, or masses Cardiac: RRR; no murmurs, rubs, or gallops,no edema  Respiratory:  clear to auscultation bilaterally, normal work of breathing GI: soft, nontender, nondistended, + BS MS: no deformity or atrophy Skin: warm and dry, no rash Neuro:  Strength and sensation are intact Psych: euthymic mood, full affect   Recent Labs: No results found for requested labs within last 8760 hours.    Lipid Panel No results found for: CHOL, HDL, LDLCALC, TRIG    Wt Readings from Last 3 Encounters:  09/12/21 184 lb 6 oz (83.6 kg)  06/22/20 189 lb 6.4 oz (85.9 kg)  03/04/16 190 lb (86.2 kg)       ASSESSMENT AND PLAN:  Problem List Items Addressed This Visit   None Visit Diagnoses     Abnormal EKG    -  Primary   Short PR-normal QRS complex syndrome          Short PR interval Discussed etiology with her PR interval on today's visit 114 ms  PR interval at [facility] 102 ms Discussed the potential for abnormal electrical conduction between the atria and the ventricles/Lown-Ganong-Levine syndrome/WPW family versus accelerated conduction through the AV node (most likely) -She does report some paroxysmal tachycardia  Paroxysmal tachycardia Etiology unclear, ZIO monitor has been ordered for further evaluation Will hold off on medications at this time until data is available No restrictions at this time No clear accessory pathway noted on today's EKG  Orthostasis Reports that she does not drink much fluid Orthostatic on todays visit down 22 systolic points with standing with increased heart rate 79 up to 87 She will try to do better with her fluids    Total encounter time more than 60 minutes  Greater than 50% was spent in counseling and coordination of care with the patient  Patient seen in consultation for [facility]. [facility] will be referred back to her office for  ongoing care of the issues detailed above  Signed, Dian Situ, M.D., Ph.D. [facility] [place], [place]

## 2021-09-23 DIAGNOSIS — I479 Paroxysmal tachycardia, unspecified: Secondary | ICD-10-CM | POA: Diagnosis not present

## 2021-09-23 DIAGNOSIS — R9431 Abnormal electrocardiogram [ECG] [EKG]: Secondary | ICD-10-CM | POA: Diagnosis not present

## 2021-09-23 DIAGNOSIS — I456 Pre-excitation syndrome: Secondary | ICD-10-CM

## 2021-10-31 ENCOUNTER — Telehealth: Payer: Self-pay

## 2021-10-31 NOTE — Telephone Encounter (Signed)
Was able to reach out to Kim Middleton via phone and make contact to review her recent ZIO monitor results. Dr. Mariah Milling advised based on the current results   Zio monitor  No patient triggered events noted  No significant arrhythmia, rare extra beats  Kim Middleton verbalized understanding, is thankful for the results call, all questions and concerns were address. Will call back for anything further, f/u as schedule.

## 2022-01-23 ENCOUNTER — Ambulatory Visit: Payer: Managed Care, Other (non HMO) | Admitting: Dermatology

## 2023-03-21 ENCOUNTER — Ambulatory Visit
Admission: EM | Admit: 2023-03-21 | Discharge: 2023-03-21 | Disposition: A | Discharge status: Home / Self Care | Payer: Managed Care, Other (non HMO)

## 2023-03-21 DIAGNOSIS — J069 Acute upper respiratory infection, unspecified: Secondary | ICD-10-CM

## 2023-03-21 NOTE — ED Triage Notes (Signed)
Patient presents to [facility] for right ear pain, productive cough, sore throat,  and chills since Saturday. Taking OTC cough meds and pain reliever. Negative at home COVID test.   Denies fever.

## 2023-03-21 NOTE — Discharge Instructions (Signed)
Follow up here or with your primary care provider if your symptoms are worsening or not improving.     

## 2023-03-21 NOTE — ED Provider Notes (Signed)
Kim Middleton    CSN: [id number] Arrival date & time: 03/21/23  1645      History   Chief Complaint Chief Complaint  Patient presents with   Otalgia   Chills    HPI Kim Middleton is a 42 y.o. female.    Otalgia   Presents to urgent care with right ear pain, productive cough, sore throat, chills x 4 days.  Taking OTC medication and pain reliever.  Endorses negative at home COVID test.  Denies fever.  She endorses nausea but states she has been drinking plenty of fluids.  She states her most concerning symptom is achiness.  She is using antipyretics/analgesics.  Past Medical History:  Diagnosis Date   Arthritis    Psoriatic-feet, hands, ? knees   Foot pain    Motion sickness    car   Sinus infection 06/03/2020   on antibiotics/ knows if not cleared up or has a fever, can't have surgery    There are no problems to display for this patient.   Past Surgical History:  Procedure Laterality Date   Carpal Tunnel Right 01/29/2015   HAMMER TOE SURGERY Right 06/22/2020   Procedure: HAMMER TOE CORRECTION 2,3,4,5;  Surgeon: Recardo Evangelist, DPM;  Location: [facility];  Service: Podiatry;  Laterality: Right;   OSTECTOMY Right 06/22/2020   Procedure: PHALANX OSTECTOMY 2,3,4,5;  Surgeon: Recardo Evangelist, DPM;  Location: [facility];  Service: Podiatry;  Laterality: Right;  LMA WITH POP BLOCK   TONSILLECTOMY      OB History   No obstetric history on file.      Home Medications    Prior to Admission medications   Medication Sig Start Date End Date Taking? Authorizing Provider  clobetasol cream (TEMOVATE) 0.05 % clobetasol 0.05 % topical cream 02/28/21   [provider]  enoxaparin (LOVENOX) 40 MG/0.4ML injection Inject 0.4 mLs (40 mg total) into the skin daily for 20 days. Patient not taking: Reported on 09/12/2021 06/22/20 07/12/20  Recardo Evangelist, DPM  gabapentin (NEURONTIN) 300 MG capsule Take 300 mg by mouth 3 (three) times daily.  08/15/21   [provider]  ibuprofen (ADVIL) 200 MG tablet Take 200 mg by mouth every 6 (six) hours as needed. Patient not taking: Reported on 09/12/2021    [provider]  medroxyPROGESTERone Acetate 150 MG/ML SUSY medroxyprogesterone 150 mg/mL intramuscular syringe 03/29/17   [provider]  oxyCODONE-acetaminophen (PERCOCET) 7.5-325 MG tablet Take 1 tablet by mouth every 4 (four) hours as needed for severe pain. Patient not taking: Reported on 09/12/2021 06/22/20   Recardo Evangelist, DPM  TALTZ 80 MG/ML SOAJ Inject into the skin. 06/21/21   [provider]    Family History Family History  Problem Relation Age of Onset   Heart disease Mother    Stroke Mother    Hypertension Mother    Depression Mother    Gout Father    Leukemia Brother     Social History Social History   Tobacco Use   Smoking status: Every Day    Packs/day: 1.00    Years: 15.00    Additional pack years: 0.00    Total pack years: 15.00    Types: Cigarettes   Smokeless tobacco: Never  Vaping Use   Vaping Use: Never used  Substance Use Topics   Alcohol use: Not Currently    Comment: very rarely   Drug use: Never     Allergies   Nickel, Other, and Latex   Review of  Systems Review of Systems  HENT:  Positive for ear pain.      Physical Exam Triage Vital Signs ED Triage Vitals [03/21/23 1705]  Enc Vitals Group     BP 134/87     Pulse Rate 83     Resp 16     Temp 97.6 F (36.4 C)     Temp Source Temporal     SpO2 97 %     Weight      Height      Head Circumference      Peak Flow      Pain Score      Pain Loc      Pain Edu?      Excl. in GC?    No data found.  Updated Vital Signs BP 134/87 (BP Location: Left Arm)   Pulse 83   Temp 97.6 F (36.4 C) (Temporal)   Resp 16   SpO2 97%   Visual Acuity Right Eye Distance:   Left Eye Distance:   Bilateral Distance:    Right Eye Near:   Left Eye Near:    Bilateral Near:     Physical  Exam Vitals reviewed.  Constitutional:      Appearance: Normal appearance. She is ill-appearing.  HENT:     Right Ear: Tympanic membrane normal.     Left Ear: Tympanic membrane normal.     Mouth/Throat:     Mouth: Mucous membranes are moist.     Pharynx: No oropharyngeal exudate or posterior oropharyngeal erythema.  Cardiovascular:     Rate and Rhythm: Normal rate and regular rhythm.     Pulses: Normal pulses.     Heart sounds: Normal heart sounds.  Pulmonary:     Effort: Pulmonary effort is normal.     Breath sounds: Normal breath sounds.  Skin:    General: Skin is warm and dry.  Neurological:     General: No focal deficit present.     Mental Status: She is alert and oriented to person, place, and time.  Psychiatric:        Mood and Affect: Mood normal.        Behavior: Behavior normal.      UC Treatments / Results  Labs (all labs ordered are listed, but only abnormal results are displayed) Labs Reviewed - No data to display  EKG   Radiology No results found.  Procedures Procedures (including critical care time)  Medications Ordered in UC Medications - No data to display  Initial Impression / Assessment and Plan / UC Course  I have reviewed the triage vital signs and the nursing notes.  Pertinent labs & imaging results that were available during my care of the patient were reviewed by me and considered in my medical decision making (see chart for details).   Kim Middleton is a 42 y.o. female presenting with URI symptoms. Patient is afebrile without recent antipyretics, satting well on room air. Overall is ill appearing though non-toxic, well hydrated, without respiratory distress. Pulmonary exam is unremarkable.  Lungs CTAB without wheezing, rhonchi, rales. RRR.  No pharyngeal erythema.  No peritonsillar exudates.  TMs are WNL bilaterally.  Reviewed relevant chart history.   Patient symptoms are consistent with an acute viral process.  Recommended  continued use of analgesics Tylenol, NSAIDs for treatment of her body aches and chills.  Otherwise recommending supportive care and use of OTC medication for symptom control.  Counseled patient on potential for adverse effects with medications prescribed/recommended today,  ER and return-to-clinic precautions discussed, patient verbalized understanding and agreement with care plan.  Final Clinical Impressions(s) / UC Diagnoses   Final diagnoses:  None   Discharge Instructions   None    ED Prescriptions   None    PDMP not reviewed this encounter.   Charma Igo, [place] 03/21/23 1717

## 2024-09-30 ENCOUNTER — Ambulatory Visit: Admission: EM | Admit: 2024-09-30 | Discharge: 2024-09-30 | Disposition: A | Discharge status: Home / Self Care

## 2024-09-30 DIAGNOSIS — J069 Acute upper respiratory infection, unspecified: Secondary | ICD-10-CM

## 2024-09-30 LAB — POC COVID19/FLU A&B COMBO
Covid Antigen, POC: NEGATIVE
Influenza A Antigen, POC: NEGATIVE
Influenza B Antigen, POC: NEGATIVE

## 2024-09-30 LAB — POCT RAPID STREP A (OFFICE): Rapid Strep A Screen: NEGATIVE

## 2024-09-30 NOTE — Discharge Instructions (Addendum)
 The strep, COVID and flu tests are negative.   Take Tylenol or ibuprofen as needed for fever or discomfort.  Take plain Mucinex as needed for congestion.  Rest and keep yourself hydrated.    Follow-up with your primary care provider if your symptoms are not improving.

## 2024-09-30 NOTE — ED Triage Notes (Addendum)
 Patient to Urgent Care with complaints of cough/ chest tightness/ sore throat/ body aches/ possible fevers/ back pain. Symptoms started Sunday.   Reports she has been exposed to parainfluenza and pneumonia.   Taking nyquil

## 2024-09-30 NOTE — ED Provider Notes (Signed)
 Kim Middleton    CSN: [id number] Arrival date & time: 09/30/24  9070      History   Chief Complaint Chief Complaint  Patient presents with   Generalized Body Aches   Sore Throat    HPI Kim Middleton is a 43 y.o. female.  Patient presents with 2-day history of congestion, sore throat, cough, chest tightness with cough, back pain.  She reports possible subjective fever.  No OTC medication taken today.  Took NyQuil last night.  No chest pain or shortness of breath.  She states her father is currently in the hospital with viral pneumonia.  Patient's medical history includes psoriatic arthritis, hyperlipidemia, current everyday smoker.  The history is provided by the patient and medical records.    Past Medical History:  Diagnosis Date   Arthritis    Psoriatic-feet, hands, ? knees   Foot pain    Motion sickness    car   Sinus infection 06/03/2020   on antibiotics/ knows if not cleared up or has a fever, can't have surgery    There are no active problems to display for this patient.   Past Surgical History:  Procedure Laterality Date   Carpal Tunnel Right 01/29/2015   HAMMER TOE SURGERY Right 06/22/2020   Procedure: HAMMER TOE CORRECTION 2,3,4,5;  Surgeon: Lilli Cough, DPM;  Location: [facility];  Service: Podiatry;  Laterality: Right;   OSTECTOMY Right 06/22/2020   Procedure: PHALANX OSTECTOMY 2,3,4,5;  Surgeon: Lilli Cough, DPM;  Location: [facility];  Service: Podiatry;  Laterality: Right;  LMA WITH POP BLOCK   TONSILLECTOMY      OB History   No obstetric history on file.      Home Medications    Prior to Admission medications   Medication Sig Start Date End Date Taking? Authorizing Provider  rosuvastatin (CRESTOR) 20 MG tablet  11/06/23  Yes [provider]  TREMFYA 100 MG/ML prefilled syringe 100 mg SQ Injection Week 0 and 4 then every 8 weeks 09/05/24  Yes [provider]  clobetasol cream (TEMOVATE)  0.05 % clobetasol 0.05 % topical cream 02/28/21   [provider]  drospirenone-ethinyl estradiol (YAZ) 3-0.02 MG tablet Take by mouth.    [provider]  enoxaparin  (LOVENOX ) 40 MG/0.4ML injection Inject 0.4 mLs (40 mg total) into the skin daily for 20 days. Patient not taking: Reported on 09/12/2021 06/22/20 07/12/20  Lilli Cough, DPM  gabapentin (NEURONTIN) 300 MG capsule Take 300 mg by mouth 3 (three) times daily. 08/15/21   [provider]  ibuprofen (ADVIL) 200 MG tablet Take 200 mg by mouth every 6 (six) hours as needed. Patient not taking: Reported on 09/12/2021    [provider]  medroxyPROGESTERone Acetate 150 MG/ML SUSY medroxyprogesterone 150 mg/mL intramuscular syringe Patient not taking: Reported on 09/30/2024 03/29/17   [provider]  oxyCODONE -acetaminophen  (PERCOCET) 7.5-325 MG tablet Take 1 tablet by mouth every 4 (four) hours as needed for severe pain. Patient not taking: Reported on 09/12/2021 06/22/20   Lilli Cough, DPM  TALTZ 80 MG/ML SOAJ Inject into the skin. Patient not taking: Reported on 09/30/2024 06/21/21   [provider]    Family History Family History  Problem Relation Age of Onset   Heart disease Mother    Stroke Mother    Hypertension Mother    Depression Mother    Gout Father    Leukemia Brother     Social History Social History   Tobacco Use   Smoking  status: Every Day    Current packs/day: 1.00    Average packs/day: 1 pack/day for 15.0 years (15.0 ttl pk-yrs)    Types: Cigarettes   Smokeless tobacco: Never  Vaping Use   Vaping status: Never Used  Substance Use Topics   Alcohol use: Not Currently    Comment: very rarely   Drug use: Never     Allergies   Nickel, Other, Cefdinir, and Latex   Review of Systems Review of Systems  Constitutional:  Negative for chills and fever.  HENT:  Positive for congestion, rhinorrhea and sore throat. Negative for ear pain.   Respiratory:   Positive for cough and chest tightness. Negative for shortness of breath.   Cardiovascular:  Negative for chest pain and palpitations.  Gastrointestinal:  Negative for diarrhea and vomiting.     Physical Exam Triage Vital Signs ED Triage Vitals  Encounter Vitals Group     BP 09/30/24 1100 132/80     Girls Systolic BP Percentile --      Girls Diastolic BP Percentile --      Boys Systolic BP Percentile --      Boys Diastolic BP Percentile --      Pulse Rate 09/30/24 1100 100     Resp 09/30/24 1100 18     Temp 09/30/24 1100 97.9 F (36.6 C)     Temp src --      SpO2 09/30/24 1100 98 %     Weight --      Height --      Head Circumference --      Peak Flow --      Pain Score 09/30/24 1111 4     Pain Loc --      Pain Education --      Exclude from Growth Chart --    No data found.  Updated Vital Signs BP 132/80   Pulse 100   Temp 97.9 F (36.6 C)   Resp 18   SpO2 98%   Visual Acuity Right Eye Distance:   Left Eye Distance:   Bilateral Distance:    Right Eye Near:   Left Eye Near:    Bilateral Near:     Physical Exam Constitutional:      General: She is not in acute distress. HENT:     Right Ear: Tympanic membrane normal.     Left Ear: Tympanic membrane normal.     Nose: Rhinorrhea present.     Mouth/Throat:     Mouth: Mucous membranes are moist.     Pharynx: Oropharynx is clear.  Cardiovascular:     Rate and Rhythm: Normal rate and regular rhythm.     Heart sounds: Normal heart sounds.  Pulmonary:     Effort: Pulmonary effort is normal. No respiratory distress.     Breath sounds: Normal breath sounds.  Neurological:     Mental Status: She is alert.      UC Treatments / Results  Labs (all labs ordered are listed, but only abnormal results are displayed) Labs Reviewed  POC COVID19/FLU A&B COMBO  POCT RAPID STREP A (OFFICE)    EKG   Radiology No results found.  Procedures Procedures (including critical care time)  Medications Ordered in  UC Medications - No data to display  Initial Impression / Assessment and Plan / UC Course  I have reviewed the triage vital signs and the nursing notes.  Pertinent labs & imaging results that were available during my care of the patient were  reviewed by me and considered in my medical decision making (see chart for details).    Viral URI.  Afebrile and vital signs are stable.  Lungs are clear and O2 sat is 98% on room air.  Rapid strep negative.  Rapid COVID and flu negative.  Discussed symptomatic treatment including Tylenol  or ibuprofen as needed for fever or discomfort, plain Mucinex as needed for congestion, rest, hydration.  Instructed patient to follow-up with her PCP if she is not improving.  ED precautions given.  Patient agrees to plan of care.  Final Clinical Impressions(s) / UC Diagnoses   Final diagnoses:  Viral URI     Discharge Instructions      The strep, COVID and flu tests are negative.   Take Tylenol  or ibuprofen as needed for fever or discomfort.  Take plain Mucinex as needed for congestion.  Rest and keep yourself hydrated.    Follow-up with your primary care provider if your symptoms are not improving.         ED Prescriptions   None    PDMP not reviewed this encounter.   Corlis Burnard DEL, NP 09/30/24 1137

## 2024-10-01 ENCOUNTER — Ambulatory Visit (INDEPENDENT_AMBULATORY_CARE_PROVIDER_SITE_OTHER): Admitting: Family Medicine

## 2024-10-01 ENCOUNTER — Encounter: Payer: Self-pay | Admitting: Family Medicine

## 2024-10-01 ENCOUNTER — Ambulatory Visit: Payer: Self-pay

## 2024-10-01 VITALS — BP 121/83 | HR 89 | Ht 67.0 in | Wt 190.8 lb

## 2024-10-01 DIAGNOSIS — L4050 Arthropathic psoriasis, unspecified: Secondary | ICD-10-CM | POA: Insufficient documentation

## 2024-10-01 DIAGNOSIS — F321 Major depressive disorder, single episode, moderate: Secondary | ICD-10-CM

## 2024-10-01 DIAGNOSIS — J440 Chronic obstructive pulmonary disease with acute lower respiratory infection: Secondary | ICD-10-CM

## 2024-10-01 DIAGNOSIS — J209 Acute bronchitis, unspecified: Secondary | ICD-10-CM | POA: Insufficient documentation

## 2024-10-01 DIAGNOSIS — M792 Neuralgia and neuritis, unspecified: Secondary | ICD-10-CM | POA: Insufficient documentation

## 2024-10-01 DIAGNOSIS — F331 Major depressive disorder, recurrent, moderate: Secondary | ICD-10-CM | POA: Diagnosis not present

## 2024-10-01 MED ORDER — ESCITALOPRAM OXALATE 5 MG PO TABS: 5.0000 mg | ORAL_TABLET | Freq: Every day | ORAL | 0 refills | Status: DC

## 2024-10-01 NOTE — Progress Notes (Signed)
 New Patient Office Visit  Subjective    Patient ID: Kim Middleton, female    DOB: 1980-12-10  Age: 43 y.o. MRN: [id number]  CC:  Chief Complaint  Patient presents with   Establish Care    HPI Kim Middleton presents to establish care Discussed the use of AI scribe software for clinical note transcription with the patient, who gave verbal consent to proceed.  History of Present Illness   Kim Middleton is a 43 year old female with psoriatic arthritis who presents with symptoms of an upper respiratory infection.  She has been experiencing symptoms of an upper respiratory infection for approximately two weeks, including chest tightness, sore throat, and a sensation of 'glass in my throat' with pain radiating down her throat. She has a cough with minimal sputum production. She visited urgent care and was diagnosed with an upper respiratory infection but was not prescribed any medication. She has not received a flu shot this year due to timing issues with her medication schedule.  She is transitioning her medication for psoriatic arthritis from Rinvoq to Tremfya. She has been off Rinvoq for about three weeks to clear it from her system before starting Tremfya. She is on short-term disability from work and has not yet started Tremfya due to her current respiratory illness.  She experiences chronic nerve pain in her right foot, which radiates from her toe up to her knee. The pain is described as a dull ache at times and a sharp, shocking sensation at others, particularly when standing for long periods. She takes gabapentin 400 mg for this pain, but finds it insufficient. She attempted to increase the dose to 600 mg but experienced side effects such as difficulty walking straight.  She reports emotional distress, feeling unable to experience emotions fully, and has had thoughts of self-harm during periods of intense pain, though she states she would not act on these thoughts due to  her family responsibilities. She has not previously taken medication for anxiety or depression. She reports difficulty focusing and feeling unaccomplished.Her PHQ9 score is 8 and her GAD 7 score is 11.    She is a smoker and has attempted to cut back. She previously tried Wellbutrin to help quit smoking but was concerned about potential interactions with caffeine. She is currently taking Yaz for birth control and occasionally uses Tylenol  or ibuprofen for pain management.  Her father is currently in the ICU on a ventilator due to parainfluenza, following a recent bout of pneumonia and fluid on his lungs. She is unable to visit him due to her illness. Her mother passed away in 2005-10-08 from renal failure, and she has two brothers, one of whom is struggling with substance abuse.     Outpatient Encounter Medications as of 10/01/2024  Medication Sig   drospirenone-ethinyl estradiol (YAZ) 3-0.02 MG tablet Take by mouth.   escitalopram (LEXAPRO) 5 MG tablet Take 1 tablet (5 mg total) by mouth daily.   gabapentin (NEURONTIN) 300 MG capsule Take 300 mg by mouth 3 (three) times daily.   rosuvastatin (CRESTOR) 20 MG tablet    TREMFYA 100 MG/ML prefilled syringe 100 mg SQ Injection Week 0 and 4 then every 8 weeks   clobetasol cream (TEMOVATE) 0.05 % clobetasol 0.05 % topical cream (Patient not taking: Reported on 10/01/2024)   enoxaparin  (LOVENOX ) 40 MG/0.4ML injection Inject 0.4 mLs (40 mg total) into the skin daily for 20 days. (Patient not taking: Reported on 10/01/2024)   ibuprofen (ADVIL) 200 MG  tablet Take 200 mg by mouth every 6 (six) hours as needed. (Patient not taking: Reported on 10/01/2024)   medroxyPROGESTERone Acetate 150 MG/ML SUSY medroxyprogesterone 150 mg/mL intramuscular syringe (Patient not taking: Reported on 10/01/2024)   oxyCODONE -acetaminophen  (PERCOCET) 7.5-325 MG tablet Take 1 tablet by mouth every 4 (four) hours as needed for severe pain. (Patient not taking: Reported on 10/01/2024)    TALTZ 80 MG/ML SOAJ Inject into the skin. (Patient not taking: Reported on 10/01/2024)   No facility-administered encounter medications on file as of 10/01/2024.    Past Medical History:  Diagnosis Date   Arthritis    Psoriatic-feet, hands, ? knees   Foot pain    Motion sickness    car   Sinus infection 06/03/2020   on antibiotics/ knows if not cleared up or has a fever, can't have surgery    Past Surgical History:  Procedure Laterality Date   Carpal Tunnel Right 01/29/2015   HAMMER TOE SURGERY Right 06/22/2020   Procedure: HAMMER TOE CORRECTION 2,3,4,5;  Surgeon: Lilli Cough, DPM;  Location: [facility];  Service: Podiatry;  Laterality: Right;   OSTECTOMY Right 06/22/2020   Procedure: PHALANX OSTECTOMY 2,3,4,5;  Surgeon: Lilli Cough, DPM;  Location: [facility];  Service: Podiatry;  Laterality: Right;  LMA WITH POP BLOCK   TONSILLECTOMY      Family History  Problem Relation Age of Onset   Heart disease Mother    Stroke Mother    Hypertension Mother    Depression Mother    Gout Father    Leukemia Brother     Social History   Socioeconomic History   Marital status: Married    Spouse name: Not on file   Number of children: Not on file   Years of education: Not on file   Highest education level: Not on file  Occupational History   Not on file  Tobacco Use   Smoking status: Every Day    Current packs/day: 1.00    Average packs/day: 1 pack/day for 15.0 years (15.0 ttl pk-yrs)    Types: Cigarettes   Smokeless tobacco: Never  Vaping Use   Vaping status: Never Used  Substance and Sexual Activity   Alcohol use: Not Currently    Comment: very rarely   Drug use: Never   Sexual activity: Not on file  Other Topics Concern   Not on file  Social History Narrative   Not on file   Social Drivers of Health   Financial Resource Strain: Low Risk (05/02/2024)   Received from [facility]   Overall Financial Resource Strain (CARDIA)    How hard  is it for you to pay for the very basics like food, housing, medical care, and heating?: Not hard at all  Food Insecurity: No Food Insecurity (05/02/2024)   Received from [facility]   Hunger Vital Sign    Within the past 12 months, you worried that your food would run out before you got the money to buy more.: Never true    Within the past 12 months, the food you bought just didn't last and you didn't have money to get more.: Never true  Transportation Needs: No Transportation Needs (05/02/2024)   Received from [facility]   PRAPARE - Transportation    Lack of Transportation (Medical): No    Lack of Transportation (Non-Medical): No  Physical Activity: Not on file  Stress: Not on file  Social Connections: Not on file  Intimate Partner Violence: Not on  file     Objective   BP 121/83   Pulse 89   Ht 5' 7 (1.702 m)   Wt 190 lb 12.8 oz (86.5 kg)   SpO2 96%   BMI 29.88 kg/m    Physical Exam Vitals and nursing note reviewed.  Constitutional:      Appearance: Normal appearance.  HENT:     Head: Normocephalic and atraumatic.  Eyes:     Conjunctiva/sclera: Conjunctivae normal.  Cardiovascular:     Rate and Rhythm: Normal rate and regular rhythm.  Pulmonary:     Effort: Pulmonary effort is normal.     Breath sounds: Normal breath sounds.  Musculoskeletal:     Right lower leg: No edema.     Left lower leg: No edema.  Skin:    General: Skin is warm and dry.  Neurological:     Mental Status: She is alert and oriented to person, place, and time.  Psychiatric:        Mood and Affect: Mood normal.        Behavior: Behavior normal.        Thought Content: Thought content normal.        Judgment: Judgment normal.            The 10-year ASCVD risk score (Arnett DK, et al., 2019) is: 2.3%     Assessment & Plan:  Depression, major, single episode, moderate (HCC) -     Escitalopram Oxalate; Take 1 tablet (5 mg total) by mouth daily.  Dispense: 30 tablet; Refill:  0  Acute bronchitis with COPD (HCC)  Acute bronchitis, unspecified organism Assessment & Plan: Likely viral in etiology presents with chest tightness and sore throat and minimal cough she has had symptoms for 5 days.  Condition expected to resolve with supportive care.  Push fluids.   Psoriatic arthritis (HCC) Assessment & Plan: Followed by rheumatology.  Has transitioned off written Volk and will start Tremfya when her bronchitis resolves.   Neuropathic pain of right lower extremity Assessment & Plan: Usually a dull ache but at times a shocklike sensation.  Exacerbated by standing for long periods.  Current management is gabapentin 400 mg which is insufficient.  Discussed potential benefits of nutritional supplements for nerve health including alpha lipoic acid MSM and CoQ 10.  May take several weeks for nutritional support to show effect.    Major depressive disorder, recurrent episode, moderate (HCC) Assessment & Plan: Moderate major depressive disorder with symptoms of emotional blunting, lack of excitement and occasional suicidal ideation without intent.  No prior treatment for depression or anxiety.  Discussed potential benefit of escitalopram for mood elevation and stress reduction.  Try escitalopram 5 mg at night and follow-up in a month.  If you do not feel safe seek emergency care help.                    Return in about 4 weeks (around 10/29/2024).   Fran Mcree K Coty Student, MD

## 2024-10-01 NOTE — Assessment & Plan Note (Signed)
 Likely viral in etiology presents with chest tightness and sore throat and minimal cough she has had symptoms for 5 days.  Condition expected to resolve with supportive care.  Push fluids.

## 2024-10-01 NOTE — Assessment & Plan Note (Signed)
 Usually a dull ache but at times a shocklike sensation.  Exacerbated by standing for long periods.  Current management is gabapentin 400 mg which is insufficient.  Discussed potential benefits of nutritional supplements for nerve health including alpha lipoic acid MSM and CoQ 10.  May take several weeks for nutritional support to show effect.

## 2024-10-01 NOTE — Assessment & Plan Note (Signed)
 Followed by rheumatology.  Has transitioned off written Volk and will start Tremfya when her bronchitis resolves.

## 2024-10-01 NOTE — Assessment & Plan Note (Signed)
 Moderate major depressive disorder with symptoms of emotional blunting, lack of excitement and occasional suicidal ideation without intent.  No prior treatment for depression or anxiety.  Discussed potential benefit of escitalopram for mood elevation and stress reduction.  Try escitalopram 5 mg at night and follow-up in a month.  If you do not feel safe seek emergency care help.

## 2024-10-29 ENCOUNTER — Ambulatory Visit (INDEPENDENT_AMBULATORY_CARE_PROVIDER_SITE_OTHER): Admitting: Family Medicine

## 2024-10-29 VITALS — BP 142/83 | HR 79 | Ht 67.0 in | Wt 193.0 lb

## 2024-10-29 DIAGNOSIS — F331 Major depressive disorder, recurrent, moderate: Secondary | ICD-10-CM

## 2024-10-29 DIAGNOSIS — M792 Neuralgia and neuritis, unspecified: Secondary | ICD-10-CM

## 2024-10-29 DIAGNOSIS — Z1231 Encounter for screening mammogram for malignant neoplasm of breast: Secondary | ICD-10-CM

## 2024-10-29 DIAGNOSIS — G6289 Other specified polyneuropathies: Secondary | ICD-10-CM

## 2024-10-29 MED ORDER — ESCITALOPRAM OXALATE 10 MG PO TABS: 10.0000 mg | ORAL_TABLET | Freq: Every day | ORAL | 1 refills | Status: AC

## 2024-10-29 MED ORDER — AMITRIPTYLINE HCL 25 MG PO TABS: 25.0000 mg | ORAL_TABLET | Freq: Every day | ORAL | 1 refills | Status: AC

## 2024-10-29 NOTE — Progress Notes (Signed)
 "  Established Patient Office Visit  Subjective   Patient ID: Kim Middleton, female    DOB: September 19, 1981  Age: 44 y.o. MRN: [id number]  Chief Complaint  Patient presents with   Medical Management of Chronic Issues    4-week follow up; denies any main concerns for today's visit.    HPI Discussed the use of AI scribe software for clinical note transcription with the patient, who gave verbal consent to proceed.  History of Present Illness   Kim Middleton is a 44 year old female with psoriatic arthritis, chronic nerve pain and anxiety who presents for medication management and follow-up.  She experiences chronic nerve pain in her right foot, extending from her toe to her knee, described as a 'sharp shock sensation.' She reports that the pain began after foot surgery in 2021, and she was told there was permanent damage. She takes gabapentin 400 mg three to four times a day, approximately every six hours, as the pain intensifies if the interval extends beyond six hours. Attempts to increase the dose to 600 mg resulted in adverse effects, including difficulty walking. She has also tried Lyrica but could not tolerate the transition period due to severe pain.  She has recently switched from Rinvoq to Tremfya for her psoriatic arthritis, having received one dose so far. She is scheduled to take the next dose in four weeks, followed by doses every eight weeks.  She experiences anxiety and nervousness, with a history of high PHQ-9 and GAD-7 scores. No thoughts of self-harm. She is currently on Lexapro , initially at 5 mg, which has not significantly improved her symptoms. No changes in mood or temper have been noted by her or those around her. She is also on Yaz for birth control.  Her father recently passed away on Jan 12, 2027and she describes complex feelings of grief, including sadness and relief due to his suffering. She has support from her brother but is not currently seeking professional  counseling. She spent Christmas with her family, including her husband and children, and has a living grandmother with whom she is not very close.        Objective:     BP (!) 142/83 (BP Location: Left Arm, Patient Position: Sitting, Cuff Size: Normal)   Pulse 79   Ht 5' 7 (1.702 m)   Wt 193 lb (87.5 kg)   SpO2 95%   BMI 30.23 kg/m    Physical Exam Vitals and nursing note reviewed.  Constitutional:      Appearance: Normal appearance.  HENT:     Head: Normocephalic and atraumatic.  Eyes:     Conjunctiva/sclera: Conjunctivae normal.  Cardiovascular:     Rate and Rhythm: Normal rate and regular rhythm.  Pulmonary:     Effort: Pulmonary effort is normal.     Breath sounds: Normal breath sounds.  Musculoskeletal:     Right lower leg: No edema.     Left lower leg: No edema.  Skin:    General: Skin is warm and dry.  Neurological:     Mental Status: She is alert and oriented to person, place, and time.  Psychiatric:        Mood and Affect: Mood normal.        Behavior: Behavior normal.        Thought Content: Thought content normal.        Judgment: Judgment normal.          No results found for any visits on 10/29/24.  The 10-year ASCVD risk score (Arnett DK, et al., 2019) is: 3.1%    Assessment & Plan:  Major depressive disorder, recurrent episode, moderate (HCC) Assessment & Plan: Depression worsened by the recent death of her father.  Her grief is quite complex.  Increasing Lexapro  to 10 mg daily.  Please follow-up in about 6 weeks.  Orders: -     Escitalopram  Oxalate; Take 1 tablet (10 mg total) by mouth daily.  Dispense: 90 tablet; Refill: 1  Other polyneuropathy -     Amitriptyline  HCl; Take 1 tablet (25 mg total) by mouth at bedtime.  Dispense: 90 tablet; Refill: 1  Neuropathic pain of right lower extremity Assessment & Plan: Has terrible neuropathic pain.  She tried to switch from gabapentin to Lyrica but the pain was so severe when she stopped  gabapentin that she could not titrate the Lyrica.  She takes gabapentin 400 mg 3-4 times a day.  She has not tolerated a stronger dose of gabapentin.  Ask her to try amitriptyline  25 mg nightly.  Advised it takes about a month to see if amitriptyline  is going to help.      Return in about 6 weeks (around 12/10/2024).    Danyal Adorno K Shy Guallpa, MD "

## 2024-10-30 DIAGNOSIS — Z1239 Encounter for other screening for malignant neoplasm of breast: Secondary | ICD-10-CM | POA: Insufficient documentation

## 2024-10-30 NOTE — Assessment & Plan Note (Signed)
 Depression worsened by the recent death of her father.  Her grief is quite complex.  Increasing Lexapro  to 10 mg daily.  Please follow-up in about 6 weeks.

## 2024-10-30 NOTE — Assessment & Plan Note (Signed)
 Has terrible neuropathic pain.  She tried to switch from gabapentin to Lyrica but the pain was so severe when she stopped gabapentin that she could not titrate the Lyrica.  She takes gabapentin 400 mg 3-4 times a day.  She has not tolerated a stronger dose of gabapentin.  Ask her to try amitriptyline  25 mg nightly.  Advised it takes about a month to see if amitriptyline  is going to help.

## 2024-10-30 NOTE — Assessment & Plan Note (Signed)
 She wants her mammogram done at [facility].

## 2024-12-10 ENCOUNTER — Ambulatory Visit: Admitting: Family Medicine
